# Patient Record
Sex: Male | Born: 1955 | Hispanic: Yes | Marital: Married | Smoking: Former smoker
Health system: Southern US, Community
[De-identification: ages and names within clinical notes are randomized; demographics above are authoritative.]

## PROBLEM LIST (undated history)

## (undated) DIAGNOSIS — E785 Hyperlipidemia, unspecified: Secondary | ICD-10-CM

## (undated) DIAGNOSIS — K219 Gastro-esophageal reflux disease without esophagitis: Secondary | ICD-10-CM

## (undated) HISTORY — PX: LUMBAR LAMINECTOMY: SHX95

## (undated) HISTORY — DX: Gastro-esophageal reflux disease without esophagitis: K21.9

## (undated) HISTORY — DX: Hyperlipidemia, unspecified: E78.5

## (undated) HISTORY — PX: SPINE SURGERY: SHX786

## (undated) HISTORY — PX: OTHER SURGICAL HISTORY: SHX169

---

## 2009-08-10 ENCOUNTER — Ambulatory Visit: Payer: Self-pay | Admitting: Specialist

## 2009-09-19 ENCOUNTER — Ambulatory Visit: Payer: Self-pay | Admitting: Specialist

## 2015-05-07 DIAGNOSIS — M109 Gout, unspecified: Secondary | ICD-10-CM | POA: Insufficient documentation

## 2016-10-20 ENCOUNTER — Ambulatory Visit
Admission: RE | Admit: 2016-10-20 | Discharge: 2016-10-20 | Disposition: A | Discharge status: Home / Self Care | Payer: Commercial Managed Care - PPO | Source: Ambulatory Visit | Attending: Allergy | Admitting: Allergy

## 2016-10-20 ENCOUNTER — Other Ambulatory Visit: Payer: Self-pay | Admitting: Allergy

## 2016-10-20 DIAGNOSIS — J4530 Mild persistent asthma, uncomplicated: Secondary | ICD-10-CM

## 2017-10-07 ENCOUNTER — Other Ambulatory Visit: Payer: Self-pay | Admitting: Physician Assistant

## 2017-10-07 ENCOUNTER — Ambulatory Visit
Admission: RE | Admit: 2017-10-07 | Discharge: 2017-10-07 | Disposition: A | Discharge status: Home / Self Care | Payer: Worker's Compensation | Source: Ambulatory Visit | Attending: Physician Assistant | Admitting: Physician Assistant

## 2017-10-07 DIAGNOSIS — M25521 Pain in right elbow: Secondary | ICD-10-CM | POA: Insufficient documentation

## 2018-12-02 IMAGING — CR DG ELBOW COMPLETE 3+V*R*
5 series · 5 of 5 positions shown · non-contrast
Comparison: None.

CLINICAL DATA: Pain following contusion type injury

EXAM:
RIGHT ELBOW - COMPLETE 3+ VIEW

[elbow ap]
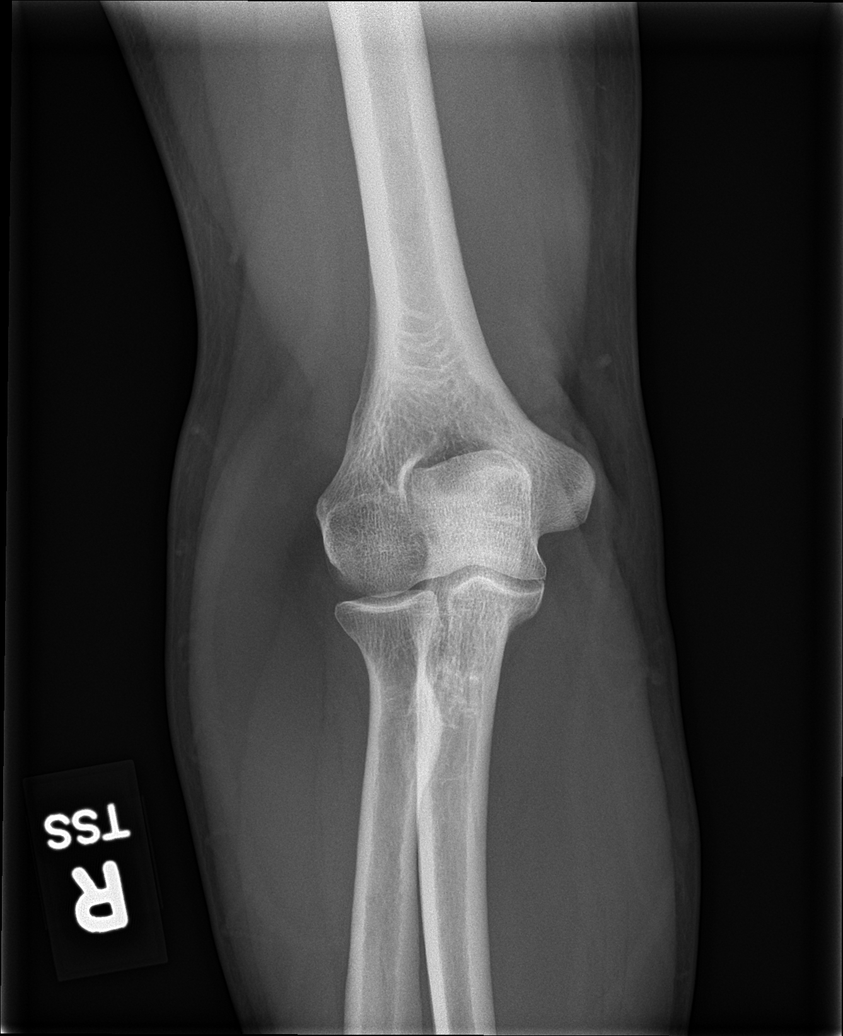

[elbow obl (1 of 2)]
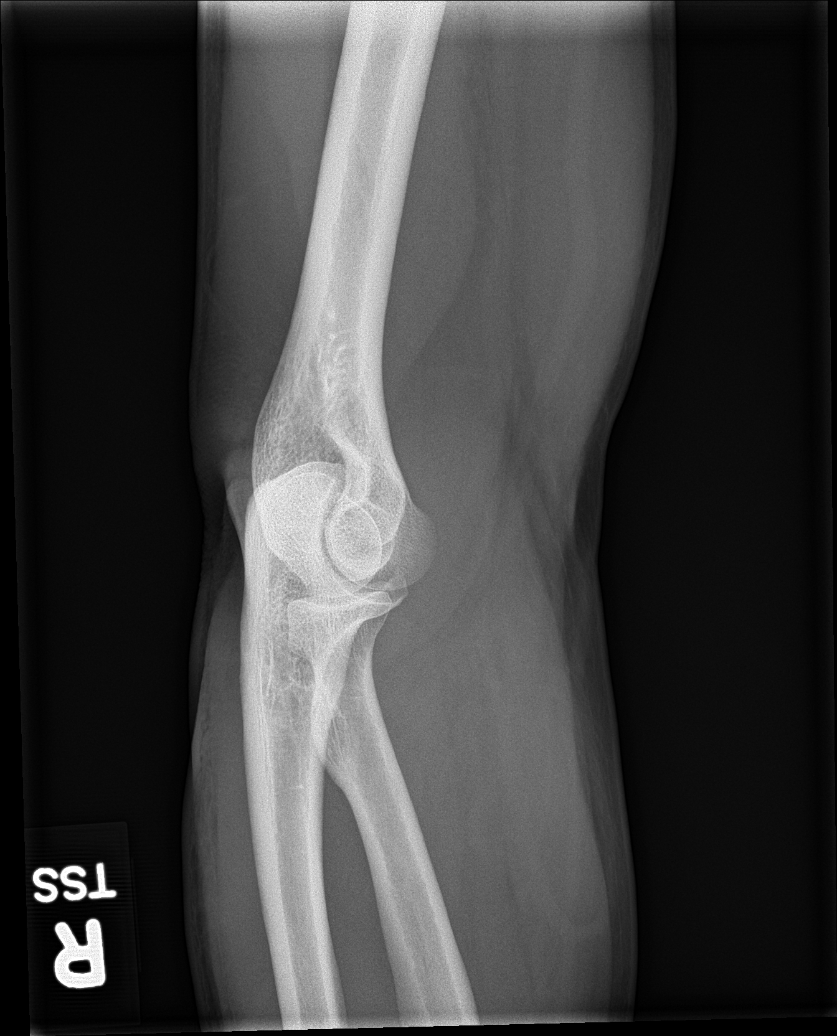

[elbow obl (2 of 2)]
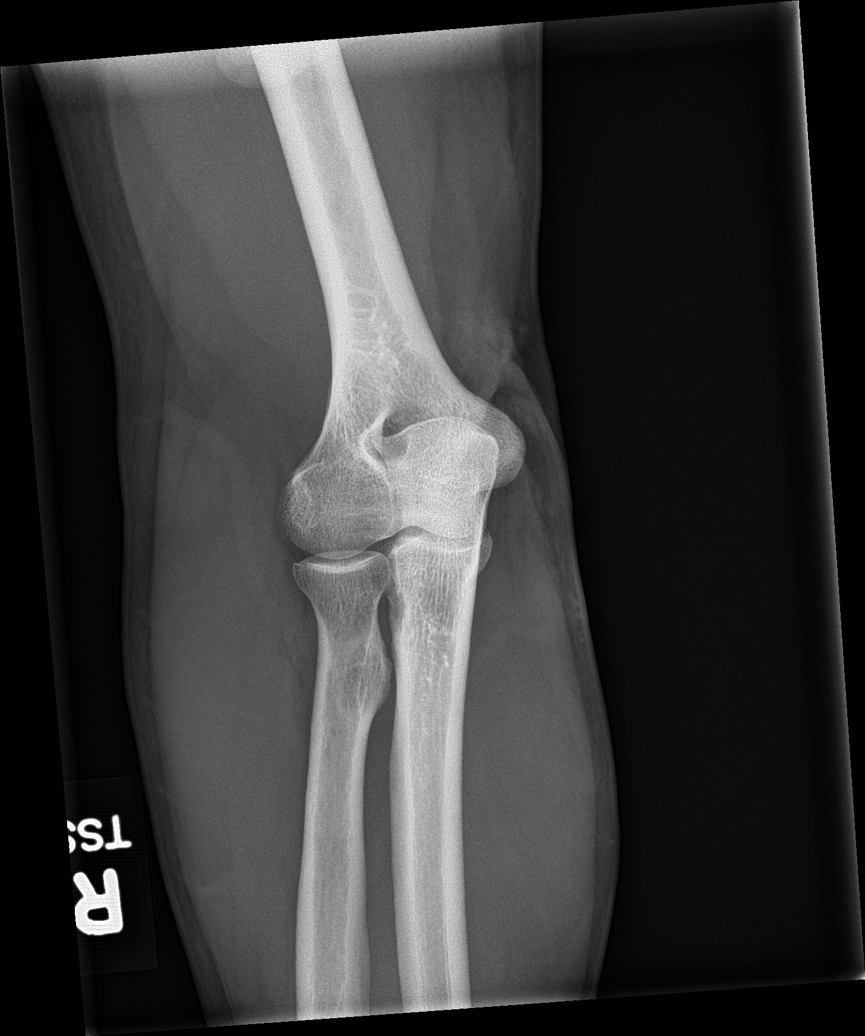

[elbow lat (1 of 2)]
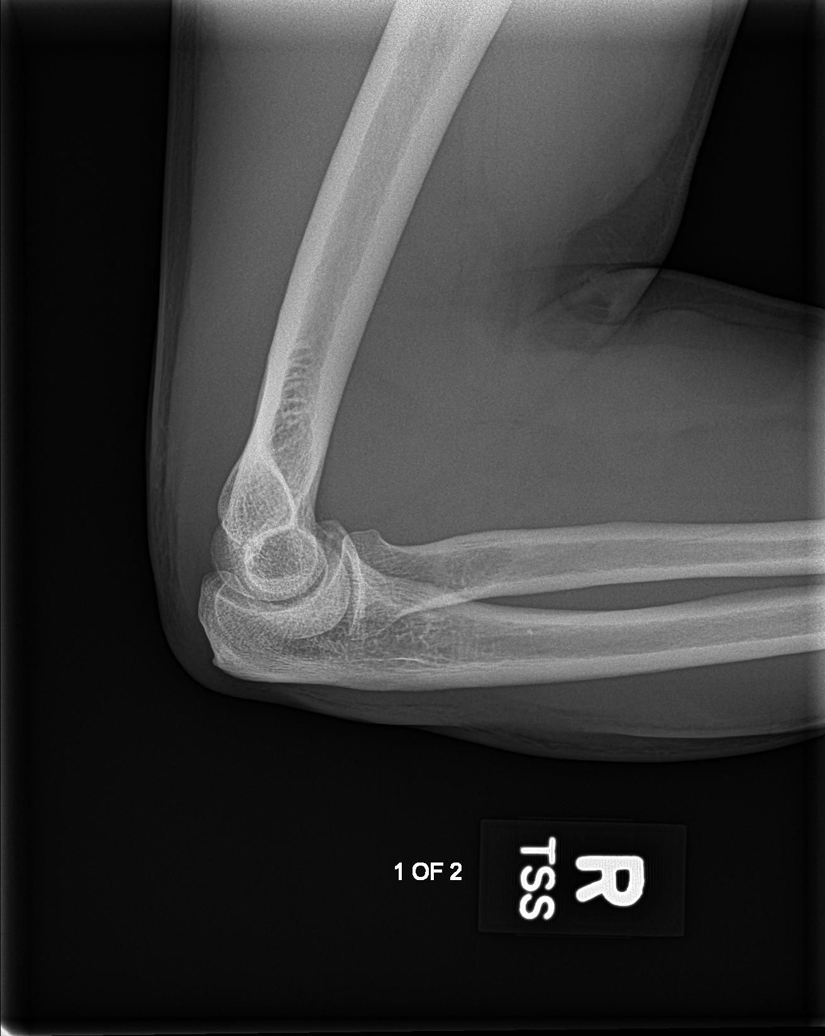

[elbow lat (2 of 2)]
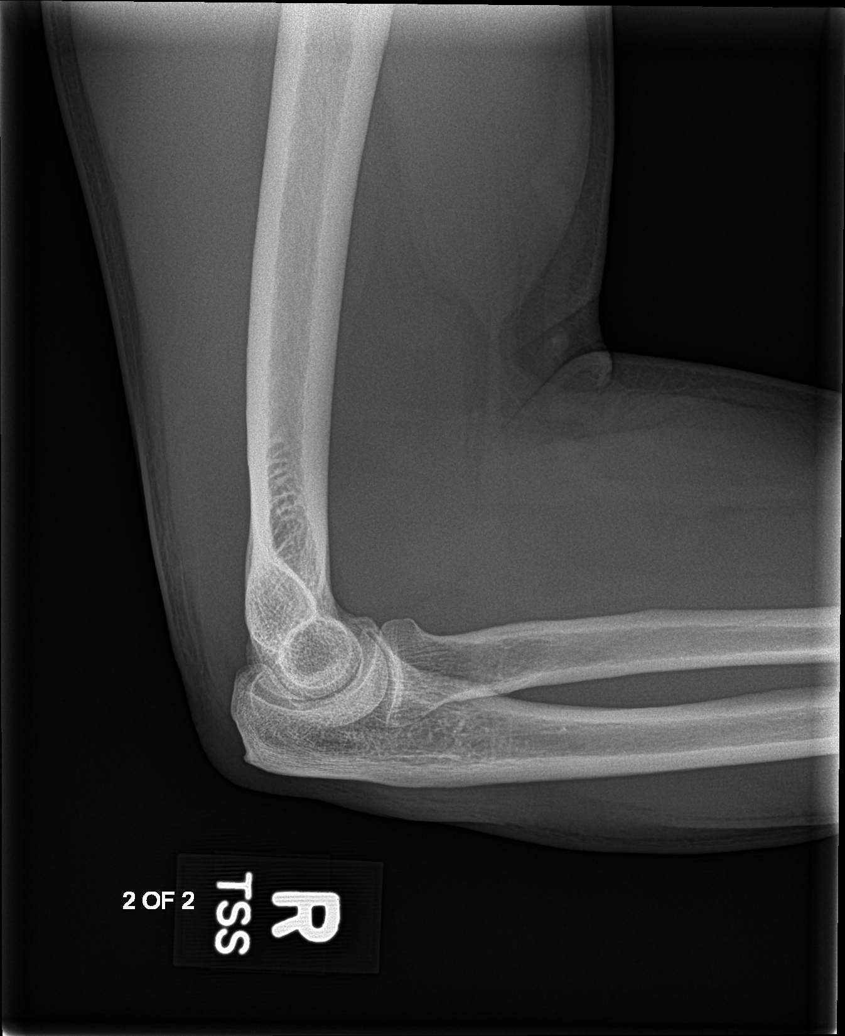

[5 of 5 positions shown; findings below may reference images not displayed]

FINDINGS: Frontal, lateral, and bilateral oblique views were obtained. No
fracture or dislocation. No joint effusion. Joint spaces appear
normal. There is a minimal spur arising from the olecranon process
of the proximal ulna.
IMPRESSION: Minimal spur arising from the olecranon process of the proximal
ulna. No fracture or dislocation. No appreciable joint space
narrowing or erosion.

## 2019-05-15 DIAGNOSIS — I10 Essential (primary) hypertension: Secondary | ICD-10-CM | POA: Insufficient documentation

## 2019-05-24 DIAGNOSIS — E782 Mixed hyperlipidemia: Secondary | ICD-10-CM | POA: Insufficient documentation

## 2019-06-15 DIAGNOSIS — I77810 Thoracic aortic ectasia: Secondary | ICD-10-CM | POA: Insufficient documentation

## 2021-08-14 DIAGNOSIS — E782 Mixed hyperlipidemia: Secondary | ICD-10-CM | POA: Diagnosis not present

## 2021-08-14 DIAGNOSIS — M109 Gout, unspecified: Secondary | ICD-10-CM | POA: Diagnosis not present

## 2021-08-14 DIAGNOSIS — K219 Gastro-esophageal reflux disease without esophagitis: Secondary | ICD-10-CM | POA: Diagnosis not present

## 2021-08-22 DIAGNOSIS — E782 Mixed hyperlipidemia: Secondary | ICD-10-CM | POA: Diagnosis not present

## 2021-08-22 DIAGNOSIS — M109 Gout, unspecified: Secondary | ICD-10-CM | POA: Diagnosis not present

## 2021-08-22 DIAGNOSIS — R0789 Other chest pain: Secondary | ICD-10-CM | POA: Diagnosis not present

## 2021-08-29 DIAGNOSIS — R0789 Other chest pain: Secondary | ICD-10-CM | POA: Diagnosis not present

## 2021-09-05 DIAGNOSIS — E669 Obesity, unspecified: Secondary | ICD-10-CM | POA: Diagnosis not present

## 2021-09-05 DIAGNOSIS — E782 Mixed hyperlipidemia: Secondary | ICD-10-CM | POA: Diagnosis not present

## 2021-09-05 DIAGNOSIS — R079 Chest pain, unspecified: Secondary | ICD-10-CM | POA: Diagnosis not present

## 2021-09-05 DIAGNOSIS — R0602 Shortness of breath: Secondary | ICD-10-CM | POA: Diagnosis not present

## 2021-09-05 DIAGNOSIS — I340 Nonrheumatic mitral (valve) insufficiency: Secondary | ICD-10-CM | POA: Diagnosis not present

## 2021-09-16 DIAGNOSIS — R079 Chest pain, unspecified: Secondary | ICD-10-CM | POA: Diagnosis not present

## 2021-10-05 DIAGNOSIS — R0789 Other chest pain: Secondary | ICD-10-CM | POA: Diagnosis not present

## 2021-10-05 DIAGNOSIS — M109 Gout, unspecified: Secondary | ICD-10-CM | POA: Diagnosis not present

## 2021-10-05 DIAGNOSIS — Z87891 Personal history of nicotine dependence: Secondary | ICD-10-CM | POA: Diagnosis not present

## 2021-10-05 DIAGNOSIS — K219 Gastro-esophageal reflux disease without esophagitis: Secondary | ICD-10-CM | POA: Diagnosis not present

## 2021-10-05 DIAGNOSIS — R079 Chest pain, unspecified: Secondary | ICD-10-CM | POA: Diagnosis not present

## 2021-10-05 DIAGNOSIS — E785 Hyperlipidemia, unspecified: Secondary | ICD-10-CM | POA: Diagnosis not present

## 2021-10-05 DIAGNOSIS — Z79899 Other long term (current) drug therapy: Secondary | ICD-10-CM | POA: Diagnosis not present

## 2021-10-06 DIAGNOSIS — R079 Chest pain, unspecified: Secondary | ICD-10-CM | POA: Diagnosis not present

## 2021-10-08 DIAGNOSIS — M545 Low back pain, unspecified: Secondary | ICD-10-CM | POA: Diagnosis not present

## 2021-10-08 DIAGNOSIS — M961 Postlaminectomy syndrome, not elsewhere classified: Secondary | ICD-10-CM | POA: Diagnosis not present

## 2021-10-13 DIAGNOSIS — R079 Chest pain, unspecified: Secondary | ICD-10-CM | POA: Diagnosis not present

## 2021-10-16 DIAGNOSIS — E669 Obesity, unspecified: Secondary | ICD-10-CM | POA: Diagnosis not present

## 2021-10-16 DIAGNOSIS — I340 Nonrheumatic mitral (valve) insufficiency: Secondary | ICD-10-CM | POA: Diagnosis not present

## 2021-10-16 DIAGNOSIS — E782 Mixed hyperlipidemia: Secondary | ICD-10-CM | POA: Diagnosis not present

## 2021-10-16 DIAGNOSIS — K219 Gastro-esophageal reflux disease without esophagitis: Secondary | ICD-10-CM | POA: Diagnosis not present

## 2021-10-16 DIAGNOSIS — R079 Chest pain, unspecified: Secondary | ICD-10-CM | POA: Diagnosis not present

## 2021-10-30 DIAGNOSIS — H25813 Combined forms of age-related cataract, bilateral: Secondary | ICD-10-CM | POA: Diagnosis not present

## 2022-01-02 DIAGNOSIS — E782 Mixed hyperlipidemia: Secondary | ICD-10-CM | POA: Diagnosis not present

## 2022-01-02 DIAGNOSIS — K219 Gastro-esophageal reflux disease without esophagitis: Secondary | ICD-10-CM | POA: Diagnosis not present

## 2022-01-02 DIAGNOSIS — E668 Other obesity: Secondary | ICD-10-CM | POA: Diagnosis not present

## 2022-01-02 DIAGNOSIS — I10 Essential (primary) hypertension: Secondary | ICD-10-CM | POA: Diagnosis not present

## 2022-01-06 DIAGNOSIS — M961 Postlaminectomy syndrome, not elsewhere classified: Secondary | ICD-10-CM | POA: Diagnosis not present

## 2022-01-06 DIAGNOSIS — M545 Low back pain, unspecified: Secondary | ICD-10-CM | POA: Diagnosis not present

## 2022-01-21 DIAGNOSIS — H2511 Age-related nuclear cataract, right eye: Secondary | ICD-10-CM | POA: Diagnosis not present

## 2022-05-01 DIAGNOSIS — Z0000 Encounter for general adult medical examination without abnormal findings: Secondary | ICD-10-CM | POA: Diagnosis not present

## 2022-05-01 DIAGNOSIS — Z23 Encounter for immunization: Secondary | ICD-10-CM | POA: Diagnosis not present

## 2022-05-01 DIAGNOSIS — Z125 Encounter for screening for malignant neoplasm of prostate: Secondary | ICD-10-CM | POA: Diagnosis not present

## 2022-05-01 DIAGNOSIS — M5136 Other intervertebral disc degeneration, lumbar region: Secondary | ICD-10-CM | POA: Diagnosis not present

## 2022-05-01 DIAGNOSIS — E782 Mixed hyperlipidemia: Secondary | ICD-10-CM | POA: Diagnosis not present

## 2022-05-01 DIAGNOSIS — E668 Other obesity: Secondary | ICD-10-CM | POA: Diagnosis not present

## 2022-05-01 DIAGNOSIS — M109 Gout, unspecified: Secondary | ICD-10-CM | POA: Diagnosis not present

## 2022-05-04 DIAGNOSIS — H018 Other specified inflammations of eyelid: Secondary | ICD-10-CM | POA: Diagnosis not present

## 2022-05-04 DIAGNOSIS — H04213 Epiphora due to excess lacrimation, bilateral lacrimal glands: Secondary | ICD-10-CM | POA: Diagnosis not present

## 2022-06-22 DIAGNOSIS — H25812 Combined forms of age-related cataract, left eye: Secondary | ICD-10-CM | POA: Diagnosis not present

## 2022-06-22 DIAGNOSIS — H04213 Epiphora due to excess lacrimation, bilateral lacrimal glands: Secondary | ICD-10-CM | POA: Diagnosis not present

## 2022-06-22 DIAGNOSIS — H018 Other specified inflammations of eyelid: Secondary | ICD-10-CM | POA: Diagnosis not present

## 2022-07-28 DIAGNOSIS — H04213 Epiphora due to excess lacrimation, bilateral lacrimal glands: Secondary | ICD-10-CM | POA: Diagnosis not present

## 2022-07-28 DIAGNOSIS — H018 Other specified inflammations of eyelid: Secondary | ICD-10-CM | POA: Diagnosis not present

## 2022-07-28 DIAGNOSIS — H25812 Combined forms of age-related cataract, left eye: Secondary | ICD-10-CM | POA: Diagnosis not present

## 2022-08-19 DIAGNOSIS — G629 Polyneuropathy, unspecified: Secondary | ICD-10-CM | POA: Diagnosis not present

## 2022-08-19 DIAGNOSIS — R0683 Snoring: Secondary | ICD-10-CM | POA: Diagnosis not present

## 2022-08-19 DIAGNOSIS — R4189 Other symptoms and signs involving cognitive functions and awareness: Secondary | ICD-10-CM | POA: Diagnosis not present

## 2022-08-19 DIAGNOSIS — M545 Low back pain, unspecified: Secondary | ICD-10-CM | POA: Diagnosis not present

## 2022-08-19 DIAGNOSIS — G8929 Other chronic pain: Secondary | ICD-10-CM | POA: Diagnosis not present

## 2022-08-24 ENCOUNTER — Other Ambulatory Visit (HOSPITAL_COMMUNITY): Payer: Self-pay | Admitting: Neurology

## 2022-08-24 DIAGNOSIS — R4189 Other symptoms and signs involving cognitive functions and awareness: Secondary | ICD-10-CM

## 2022-08-30 ENCOUNTER — Encounter (HOSPITAL_COMMUNITY): Payer: Self-pay

## 2022-08-30 ENCOUNTER — Ambulatory Visit (HOSPITAL_COMMUNITY): Admission: RE | Admit: 2022-08-30 | Payer: Medicare (Managed Care) | Source: Ambulatory Visit

## 2022-09-08 DIAGNOSIS — R200 Anesthesia of skin: Secondary | ICD-10-CM | POA: Diagnosis not present

## 2022-09-08 DIAGNOSIS — R202 Paresthesia of skin: Secondary | ICD-10-CM | POA: Diagnosis not present

## 2022-09-08 DIAGNOSIS — G8929 Other chronic pain: Secondary | ICD-10-CM | POA: Diagnosis not present

## 2022-09-08 DIAGNOSIS — M545 Low back pain, unspecified: Secondary | ICD-10-CM | POA: Diagnosis not present

## 2022-09-14 ENCOUNTER — Other Ambulatory Visit: Payer: Self-pay | Admitting: Neurology

## 2022-09-14 DIAGNOSIS — E782 Mixed hyperlipidemia: Secondary | ICD-10-CM | POA: Diagnosis not present

## 2022-09-14 DIAGNOSIS — R0789 Other chest pain: Secondary | ICD-10-CM | POA: Diagnosis not present

## 2022-09-14 DIAGNOSIS — I77810 Thoracic aortic ectasia: Secondary | ICD-10-CM | POA: Diagnosis not present

## 2022-09-14 DIAGNOSIS — I10 Essential (primary) hypertension: Secondary | ICD-10-CM | POA: Diagnosis not present

## 2022-09-14 DIAGNOSIS — M545 Low back pain, unspecified: Secondary | ICD-10-CM

## 2022-09-19 ENCOUNTER — Ambulatory Visit: Payer: Medicare (Managed Care)

## 2022-09-24 ENCOUNTER — Encounter: Payer: Self-pay | Admitting: Internal Medicine

## 2022-09-24 ENCOUNTER — Ambulatory Visit (INDEPENDENT_AMBULATORY_CARE_PROVIDER_SITE_OTHER): Payer: Medicare (Managed Care) | Admitting: Internal Medicine

## 2022-09-24 VITALS — BP 134/78 | HR 87 | Ht 66.0 in | Wt 198.0 lb

## 2022-09-24 DIAGNOSIS — E782 Mixed hyperlipidemia: Secondary | ICD-10-CM

## 2022-09-24 DIAGNOSIS — K219 Gastro-esophageal reflux disease without esophagitis: Secondary | ICD-10-CM | POA: Diagnosis not present

## 2022-09-24 DIAGNOSIS — M5432 Sciatica, left side: Secondary | ICD-10-CM

## 2022-09-24 DIAGNOSIS — M5416 Radiculopathy, lumbar region: Secondary | ICD-10-CM

## 2022-09-24 DIAGNOSIS — M1A09X0 Idiopathic chronic gout, multiple sites, without tophus (tophi): Secondary | ICD-10-CM

## 2022-09-24 DIAGNOSIS — M48061 Spinal stenosis, lumbar region without neurogenic claudication: Secondary | ICD-10-CM | POA: Insufficient documentation

## 2022-09-24 HISTORY — DX: Radiculopathy, lumbar region: M48.061

## 2022-09-24 HISTORY — DX: Sciatica, left side: M54.32

## 2022-09-24 NOTE — Progress Notes (Deleted)
   Established Patient Office Visit  Subjective:  Patient ID: Mike Barnes, male    DOB: 12-05-1955  Age: 67 y.o. MRN: [id number]  Chief Complaint  Patient presents with   Follow-up    paperwork       No past medical history on file.  Social History   Socioeconomic History   Marital status: Married    Spouse name: Not on file   Number of children: Not on file   Years of education: Not on file   Highest education level: Not on file  Occupational History   Not on file  Tobacco Use   Smoking status: Not on file   Smokeless tobacco: Not on file  Substance and Sexual Activity   Alcohol use: Not on file   Drug use: Not on file   Sexual activity: Not on file  Other Topics Concern   Not on file  Social History Narrative   Not on file   Social Determinants of Health   Financial Resource Strain: Not on file  Food Insecurity: Not on file  Transportation Needs: Not on file  Physical Activity: Not on file  Stress: Not on file  Social Connections: Not on file  Intimate Partner Violence: Not on file    No family history on file.  No Known Allergies  ROS     Objective:   BP 134/78   Pulse 87   Ht 5' 6"$  (1.676 m)   Wt 198 lb (89.8 kg)   SpO2 94%   BMI 31.96 kg/m   Vitals:   09/24/22 0947  BP: 134/78  Pulse: 87  Height: 5' 6"$  (1.676 m)  Weight: 198 lb (89.8 kg)  SpO2: 94%  BMI (Calculated): 31.97    Physical Exam   No results found for any visits on 09/24/22.  No results found for this or any previous visit (from the past 2160 hour(s)).    Assessment & Plan:   Problem List Items Addressed This Visit   None Visit Diagnoses     Idiopathic chronic gout of multiple sites without tophus    -  Primary   Relevant Orders   CMP14+EGFR   Uric acid   Mixed hyperlipidemia       Relevant Medications   rosuvastatin (CRESTOR) 10 MG tablet   Other Relevant Orders   CMP14+EGFR   Lipid Panel w/o Chol/HDL Ratio       Return in about 3 months  (around 12/23/2022).   Total time spent: {AMA time spent:29001} minutes  Perrin Maltese, MD  09/24/2022

## 2022-09-24 NOTE — Progress Notes (Signed)
Established Patient Office Visit  Subjective:  Patient ID: Mike Barnes, male    DOB: April 09, 1956  Age: 67 y.o. MRN: [id number]  Chief Complaint  Patient presents with   Follow-up    paperwork   Hyperlipidemia    Patient comes in for his follow-up today.  He is fasting for blood work.  He continues to have low back pain despite his previous surgeries on the lumbar spine.  He is unable to able to stand for a long period, so I am able to maintain gainful employment.   Past Medical History:  Diagnosis Date   GERD (gastroesophageal reflux disease)    Hyperlipidemia    Left sided sciatica 09/24/2022   Spinal stenosis of lumbar region with radiculopathy 09/24/2022    Social History   Socioeconomic History   Marital status: Married    Spouse name: Not on file   Number of children: Not on file   Years of education: Not on file   Highest education level: Not on file  Occupational History   Not on file  Tobacco Use   Smoking status: Former    Types: Cigarettes   Smokeless tobacco: Never  Substance and Sexual Activity   Alcohol use: Not Currently   Drug use: Never   Sexual activity: Not on file  Other Topics Concern   Not on file  Social History Narrative   Not on file   Social Determinants of Health   Financial Resource Strain: Not on file  Food Insecurity: Not on file  Transportation Needs: Not on file  Physical Activity: Not on file  Stress: Not on file  Social Connections: Not on file  Intimate Partner Violence: Not on file    Family History  Problem Relation Age of Onset   Hypertension Mother    Gout Father     No Known Allergies  Review of Systems  Constitutional:  Positive for malaise/fatigue. Negative for chills and fever.  HENT: Negative.    Eyes: Negative.   Respiratory: Negative.    Cardiovascular: Negative.   Gastrointestinal:  Positive for heartburn. Negative for blood in stool, constipation, melena, nausea and vomiting.  Genitourinary:  Negative.   Musculoskeletal:  Positive for back pain, falls and myalgias.  Skin: Negative.   Neurological:  Negative for tremors, focal weakness and weakness.  Endo/Heme/Allergies: Negative.   Psychiatric/Behavioral: Negative.         Objective:   BP 134/78   Pulse 87   Ht '5\' 6"'$  (1.676 m)   Wt 198 lb (89.8 kg)   SpO2 94%   BMI 31.96 kg/m   Vitals:   09/24/22 0947  BP: 134/78  Pulse: 87  Height: '5\' 6"'$  (1.676 m)  Weight: 198 lb (89.8 kg)  SpO2: 94%  BMI (Calculated): 31.97    Physical Exam Vitals and nursing note reviewed.  Constitutional:      Appearance: Normal appearance. He is obese.  HENT:     Head: Normocephalic.  Cardiovascular:     Rate and Rhythm: Normal rate and regular rhythm.  Pulmonary:     Effort: Pulmonary effort is normal.     Breath sounds: Normal breath sounds.  Abdominal:     General: Abdomen is flat. Bowel sounds are normal.     Palpations: Abdomen is soft.  Musculoskeletal:        General: Normal range of motion.     Cervical back: Normal range of motion.  Skin:    General: Skin is warm and dry.  Neurological:     General: No focal deficit present.     Mental Status: He is alert and oriented to person, place, and time.  Psychiatric:        Mood and Affect: Mood normal.        Behavior: Behavior normal.      No results found for any visits on 09/24/22.  No results found for this or any previous visit (from the past 2160 hour(s)).    Assessment & Plan:  Patient advised to continue all his medications.  He will get fasting blood work today.  Forms will be filled out for his disability due to back pain. Problem List Items Addressed This Visit     Idiopathic chronic gout of multiple sites without tophus - Primary   Relevant Orders   CMP14+EGFR   Uric acid   Mixed hyperlipidemia   Relevant Medications   rosuvastatin (CRESTOR) 10 MG tablet   Other Relevant Orders   CMP14+EGFR   Lipid Panel w/o Chol/HDL Ratio   Spinal stenosis  of lumbar region with radiculopathy   Relevant Medications   gabapentin (NEURONTIN) 300 MG capsule   Left sided sciatica   Relevant Medications   gabapentin (NEURONTIN) 300 MG capsule   Gastroesophageal reflux disease without esophagitis   Relevant Medications   famotidine (PEPCID) 20 MG tablet   pantoprazole (PROTONIX) 40 MG tablet    Return in about 3 months (around 12/23/2022).   Total time spent: 30 minutes  Perrin Maltese, MD  09/24/2022

## 2022-09-25 ENCOUNTER — Other Ambulatory Visit: Payer: Self-pay | Admitting: Internal Medicine

## 2022-09-25 DIAGNOSIS — R7302 Impaired glucose tolerance (oral): Secondary | ICD-10-CM

## 2022-09-25 LAB — CMP14+EGFR
ALT: 32 IU/L (ref 0–44)
AST: 35 IU/L (ref 0–40)
Albumin/Globulin Ratio: 1.9 (ref 1.2–2.2)
Albumin: 4.6 g/dL (ref 3.9–4.9)
Alkaline Phosphatase: 70 IU/L (ref 44–121)
BUN/Creatinine Ratio: 15 (ref 10–24)
BUN: 15 mg/dL (ref 8–27)
Bilirubin Total: 0.5 mg/dL (ref 0.0–1.2)
CO2: 25 mmol/L (ref 20–29)
Calcium: 9.6 mg/dL (ref 8.6–10.2)
Chloride: 99 mmol/L (ref 96–106)
Creatinine, Ser: 1.01 mg/dL (ref 0.76–1.27)
Globulin, Total: 2.4 g/dL (ref 1.5–4.5)
Glucose: 169 mg/dL — ABNORMAL HIGH (ref 70–99)
Potassium: 4.2 mmol/L (ref 3.5–5.2)
Sodium: 139 mmol/L (ref 134–144)
Total Protein: 7 g/dL (ref 6.0–8.5)
eGFR: 82 mL/min/{1.73_m2} (ref >59)

## 2022-09-25 LAB — LIPID PANEL W/O CHOL/HDL RATIO
Cholesterol, Total: 133 mg/dL (ref 100–199)
HDL: 50 mg/dL (ref >39)
LDL Chol Calc (NIH): 54 mg/dL (ref 0–99)
Triglycerides: 176 mg/dL — ABNORMAL HIGH (ref 0–149)
VLDL Cholesterol Cal: 29 mg/dL (ref 5–40)

## 2022-09-25 LAB — URIC ACID: Uric Acid: 6.6 mg/dL (ref 3.8–8.4)

## 2022-10-26 ENCOUNTER — Other Ambulatory Visit: Payer: Self-pay | Admitting: Cardiovascular Disease

## 2022-10-26 DIAGNOSIS — R0789 Other chest pain: Secondary | ICD-10-CM

## 2022-11-08 ENCOUNTER — Other Ambulatory Visit: Payer: Self-pay | Admitting: Internal Medicine

## 2022-11-10 ENCOUNTER — Other Ambulatory Visit: Payer: Self-pay

## 2022-11-13 ENCOUNTER — Ambulatory Visit (HOSPITAL_COMMUNITY)
Admission: RE | Admit: 2022-11-13 | Discharge: 2022-11-13 | Disposition: A | Discharge status: Home / Self Care | Payer: Medicare (Managed Care) | Source: Ambulatory Visit | Attending: Neurology | Admitting: Neurology

## 2022-11-13 DIAGNOSIS — R4189 Other symptoms and signs involving cognitive functions and awareness: Secondary | ICD-10-CM | POA: Diagnosis not present

## 2022-11-13 DIAGNOSIS — M545 Low back pain, unspecified: Secondary | ICD-10-CM | POA: Insufficient documentation

## 2022-11-13 DIAGNOSIS — G9389 Other specified disorders of brain: Secondary | ICD-10-CM | POA: Diagnosis not present

## 2022-11-13 DIAGNOSIS — M5126 Other intervertebral disc displacement, lumbar region: Secondary | ICD-10-CM | POA: Diagnosis not present

## 2022-11-27 DIAGNOSIS — M545 Low back pain, unspecified: Secondary | ICD-10-CM | POA: Diagnosis not present

## 2022-11-27 DIAGNOSIS — M48061 Spinal stenosis, lumbar region without neurogenic claudication: Secondary | ICD-10-CM | POA: Diagnosis not present

## 2022-11-27 DIAGNOSIS — M5416 Radiculopathy, lumbar region: Secondary | ICD-10-CM | POA: Diagnosis not present

## 2022-11-27 DIAGNOSIS — M542 Cervicalgia: Secondary | ICD-10-CM | POA: Diagnosis not present

## 2022-11-27 DIAGNOSIS — G939 Disorder of brain, unspecified: Secondary | ICD-10-CM | POA: Diagnosis not present

## 2022-11-27 DIAGNOSIS — R4189 Other symptoms and signs involving cognitive functions and awareness: Secondary | ICD-10-CM | POA: Diagnosis not present

## 2022-11-27 DIAGNOSIS — R202 Paresthesia of skin: Secondary | ICD-10-CM | POA: Diagnosis not present

## 2022-11-27 DIAGNOSIS — N400 Enlarged prostate without lower urinary tract symptoms: Secondary | ICD-10-CM | POA: Insufficient documentation

## 2022-11-27 DIAGNOSIS — G8929 Other chronic pain: Secondary | ICD-10-CM | POA: Diagnosis not present

## 2022-11-30 DIAGNOSIS — H25813 Combined forms of age-related cataract, bilateral: Secondary | ICD-10-CM | POA: Diagnosis not present

## 2022-11-30 DIAGNOSIS — H25812 Combined forms of age-related cataract, left eye: Secondary | ICD-10-CM | POA: Diagnosis not present

## 2022-12-02 ENCOUNTER — Other Ambulatory Visit: Payer: Self-pay | Admitting: Student

## 2022-12-02 DIAGNOSIS — R202 Paresthesia of skin: Secondary | ICD-10-CM

## 2022-12-02 DIAGNOSIS — M542 Cervicalgia: Secondary | ICD-10-CM

## 2022-12-17 ENCOUNTER — Encounter: Payer: Self-pay | Admitting: Student

## 2022-12-23 ENCOUNTER — Ambulatory Visit
Admission: RE | Admit: 2022-12-23 | Discharge: 2022-12-23 | Disposition: A | Discharge status: Home / Self Care | Payer: Medicare (Managed Care) | Source: Ambulatory Visit | Attending: Student | Admitting: Student

## 2022-12-23 DIAGNOSIS — R202 Paresthesia of skin: Secondary | ICD-10-CM | POA: Diagnosis not present

## 2022-12-23 DIAGNOSIS — M542 Cervicalgia: Secondary | ICD-10-CM

## 2022-12-24 ENCOUNTER — Encounter: Payer: Self-pay | Admitting: Internal Medicine

## 2022-12-24 ENCOUNTER — Ambulatory Visit (INDEPENDENT_AMBULATORY_CARE_PROVIDER_SITE_OTHER): Payer: Medicare (Managed Care) | Admitting: Internal Medicine

## 2022-12-24 VITALS — BP 140/82 | HR 78 | Ht 66.0 in | Wt 201.0 lb

## 2022-12-24 DIAGNOSIS — M1A09X0 Idiopathic chronic gout, multiple sites, without tophus (tophi): Secondary | ICD-10-CM

## 2022-12-24 DIAGNOSIS — R7303 Prediabetes: Secondary | ICD-10-CM | POA: Diagnosis not present

## 2022-12-24 DIAGNOSIS — I10 Essential (primary) hypertension: Secondary | ICD-10-CM | POA: Diagnosis not present

## 2022-12-24 DIAGNOSIS — E782 Mixed hyperlipidemia: Secondary | ICD-10-CM | POA: Diagnosis not present

## 2022-12-24 MED ORDER — LISINOPRIL 5 MG PO TABS
5.0000 mg | ORAL_TABLET | Freq: Every day | ORAL | 6 refills | Status: DC
Start: 2022-12-24 — End: 2023-04-16

## 2022-12-24 NOTE — Progress Notes (Signed)
Established Patient Office Visit  Subjective:  Patient ID: Mike Barnes, male    DOB: [birth year]  Age: 67 y.o. MRN: [id number]  Chief Complaint  Patient presents with   Follow-up    3 month follow up    Patient comes in for his 60-month follow-up today.  He states he is not feeling well.  He has several blood pressure readings from home which are high.  Patient's daughter got on the phone to discuss his blood pressure situation.  At his last labs his blood glucose was also found to be high.  We were trying to contact the patient to come in for hemoglobin A1c but he never responded.  Will get fasting labs today.  And start him on Lisinopril 5 mg once a day. Patient's daughter was informed of this who then translated to her father.  She was advised to come with him for his next appointment. In general he feels tired and  also has chronic low back pain.  He recently had his right eye cataract surgery done. He does not have any chest pain or shortness of breath.  Does not report of any dizziness.    No other concerns at this time.   Past Medical History:  Diagnosis Date   GERD (gastroesophageal reflux disease)    Hyperlipidemia    Left sided sciatica 09/24/2022   Spinal stenosis of lumbar region with radiculopathy 09/24/2022    Past Surgical History:  Procedure Laterality Date   left shoulder surgery     LUMBAR LAMINECTOMY Left    SPINE SURGERY      Social History   Socioeconomic History   Marital status: Married    Spouse name: Not on file   Number of children: Not on file   Years of education: Not on file   Highest education level: Not on file  Occupational History   Not on file  Tobacco Use   Smoking status: Former    Types: Cigarettes   Smokeless tobacco: Never  Substance and Sexual Activity   Alcohol use: Not Currently   Drug use: Never   Sexual activity: Not on file  Other Topics Concern   Not on file  Social History Narrative   Not on file   Social  Determinants of Health   Financial Resource Strain: Not on file  Food Insecurity: Not on file  Transportation Needs: Not on file  Physical Activity: Not on file  Stress: Not on file  Social Connections: Not on file  Intimate Partner Violence: Not on file    Family History  Problem Relation Age of Onset   Hypertension Mother    Gout Father     No Known Allergies  Review of Systems  Constitutional:  Positive for malaise/fatigue. Negative for chills, diaphoresis, fever and weight loss.  HENT: Negative.    Eyes:  Positive for blurred vision. Negative for double vision, photophobia, pain, discharge and redness.  Respiratory:  Negative for cough, shortness of breath and wheezing.   Cardiovascular:  Negative for chest pain, orthopnea, leg swelling and PND.  Gastrointestinal:  Negative for abdominal pain, blood in stool, constipation, diarrhea, heartburn and nausea.  Genitourinary:  Negative for dysuria and flank pain.  Musculoskeletal:  Positive for back pain and joint pain. Negative for falls and myalgias.  Skin: Negative.   Neurological:  Negative for dizziness, focal weakness and headaches.  Psychiatric/Behavioral:  Negative for depression, hallucinations and suicidal ideas. The patient is not nervous/anxious.  Objective:   BP (!) 140/82   Pulse 78   Ht 5\' 6"  (1.676 m)   Wt 201 lb (91.2 kg)   SpO2 97%   BMI 32.44 kg/m   Vitals:   12/24/22 0951  BP: (!) 140/82  Pulse: 78  Height: 5\' 6"  (1.676 m)  Weight: 201 lb (91.2 kg)  SpO2: 97%  BMI (Calculated): 32.46    Physical Exam Vitals and nursing note reviewed.  Constitutional:      General: He is not in acute distress.    Appearance: Normal appearance.  HENT:     Head: Normocephalic and atraumatic.  Cardiovascular:     Rate and Rhythm: Normal rate and regular rhythm.     Pulses: Normal pulses.     Heart sounds: Normal heart sounds. No murmur heard. Pulmonary:     Effort: Pulmonary effort is normal.      Breath sounds: Normal breath sounds. No wheezing or rales.  Abdominal:     General: Bowel sounds are normal. There is no distension.     Palpations: Abdomen is soft. There is no mass.     Tenderness: There is no right CVA tenderness or left CVA tenderness.  Musculoskeletal:        General: Normal range of motion.     Cervical back: Normal range of motion and neck supple.     Right lower leg: No edema.     Left lower leg: No edema.  Lymphadenopathy:     Cervical: No cervical adenopathy.  Skin:    General: Skin is warm.  Neurological:     General: No focal deficit present.     Mental Status: He is alert.  Psychiatric:        Mood and Affect: Mood normal.        Behavior: Behavior normal.      No results found for any visits on 12/24/22.  No results found for this or any previous visit (from the past 2160 hour(s)).    Assessment & Plan:  Patient will get his blood work done today.  Start Zestril 5 mg p.o. once a day.  Monitor blood pressure at home.  Patient will return in 1 week with his daughter to discuss the lab results. Problem List Items Addressed This Visit     Idiopathic chronic gout of multiple sites without tophus   Relevant Orders   CBC With Differential   Mixed hyperlipidemia   Relevant Medications   lisinopril (ZESTRIL) 5 MG tablet   Other Relevant Orders   Lipid Panel w/o Chol/HDL Ratio   Essential hypertension, benign - Primary   Relevant Medications   lisinopril (ZESTRIL) 5 MG tablet   Other Relevant Orders   CMP14+EGFR   Prediabetes   Relevant Orders   Hemoglobin A1c    Return in about 1 week (around 12/31/2022).   Total time spent: 30 minutes  Margaretann Loveless, MD  12/24/2022   This document may have been prepared by [facility] Voice Recognition software and as such may include unintentional dictation errors.

## 2022-12-25 LAB — LIPID PANEL W/O CHOL/HDL RATIO
Cholesterol, Total: 163 mg/dL (ref 100–199)
HDL: 60 mg/dL (ref >39)
LDL Chol Calc (NIH): 73 mg/dL (ref 0–99)
Triglycerides: 178 mg/dL — ABNORMAL HIGH (ref 0–149)
VLDL Cholesterol Cal: 30 mg/dL (ref 5–40)

## 2022-12-25 LAB — CMP14+EGFR
ALT: 28 IU/L (ref 0–44)
AST: 33 IU/L (ref 0–40)
Albumin/Globulin Ratio: 1.9 (ref 1.2–2.2)
Albumin: 4.8 g/dL (ref 3.9–4.9)
Alkaline Phosphatase: 76 IU/L (ref 44–121)
BUN/Creatinine Ratio: 18 (ref 10–24)
BUN: 18 mg/dL (ref 8–27)
Bilirubin Total: 0.6 mg/dL (ref 0.0–1.2)
CO2: 25 mmol/L (ref 20–29)
Calcium: 10.2 mg/dL (ref 8.6–10.2)
Chloride: 98 mmol/L (ref 96–106)
Creatinine, Ser: 1 mg/dL (ref 0.76–1.27)
Globulin, Total: 2.5 g/dL (ref 1.5–4.5)
Glucose: 110 mg/dL — ABNORMAL HIGH (ref 70–99)
Potassium: 4.6 mmol/L (ref 3.5–5.2)
Sodium: 138 mmol/L (ref 134–144)
Total Protein: 7.3 g/dL (ref 6.0–8.5)
eGFR: 83 mL/min/{1.73_m2} (ref >59)

## 2022-12-25 LAB — CBC WITH DIFFERENTIAL
Basophils Absolute: 0.1 10*3/uL (ref 0.0–0.2)
Basos: 1 %
EOS (ABSOLUTE): 0.4 10*3/uL (ref 0.0–0.4)
Eos: 5 %
Hematocrit: 44.6 % (ref 37.5–51.0)
Hemoglobin: 15.3 g/dL (ref 13.0–17.7)
Immature Grans (Abs): 0 10*3/uL (ref 0.0–0.1)
Immature Granulocytes: 0 %
Lymphocytes Absolute: 2.7 10*3/uL (ref 0.7–3.1)
Lymphs: 42 %
MCH: 32.7 pg (ref 26.6–33.0)
MCHC: 34.3 g/dL (ref 31.5–35.7)
MCV: 95 fL (ref 79–97)
Monocytes Absolute: 0.6 10*3/uL (ref 0.1–0.9)
Monocytes: 9 %
Neutrophils Absolute: 2.8 10*3/uL (ref 1.4–7.0)
Neutrophils: 43 %
RBC: 4.68 x10E6/uL (ref 4.14–5.80)
RDW: 13.2 % (ref 11.6–15.4)
WBC: 6.4 10*3/uL (ref 3.4–10.8)

## 2022-12-25 LAB — HEMOGLOBIN A1C
Est. average glucose Bld gHb Est-mCnc: 131 mg/dL
Hgb A1c MFr Bld: 6.2 % — ABNORMAL HIGH (ref 4.8–5.6)

## 2022-12-29 ENCOUNTER — Ambulatory Visit: Payer: Medicare (Managed Care) | Admitting: Neurosurgery

## 2022-12-31 ENCOUNTER — Ambulatory Visit (INDEPENDENT_AMBULATORY_CARE_PROVIDER_SITE_OTHER): Payer: Medicare (Managed Care) | Admitting: Internal Medicine

## 2022-12-31 ENCOUNTER — Encounter: Payer: Self-pay | Admitting: Internal Medicine

## 2022-12-31 VITALS — BP 116/72 | HR 80 | Ht 63.0 in | Wt 203.4 lb

## 2022-12-31 DIAGNOSIS — I10 Essential (primary) hypertension: Secondary | ICD-10-CM

## 2022-12-31 DIAGNOSIS — R7303 Prediabetes: Secondary | ICD-10-CM

## 2022-12-31 DIAGNOSIS — E782 Mixed hyperlipidemia: Secondary | ICD-10-CM

## 2022-12-31 DIAGNOSIS — M1A09X0 Idiopathic chronic gout, multiple sites, without tophus (tophi): Secondary | ICD-10-CM

## 2022-12-31 NOTE — Progress Notes (Signed)
Established Patient Office Visit  Subjective:  Patient ID: Mike Barnes, male    DOB: 11/26/1955  Age: 66 y.o. MRN: [id number]  Chief Complaint  Patient presents with   Follow-up    1 week follow up    Patient comes in for his follow-up today, accompanied by his wife who helps to translate.  Last week patient had come in with high blood pressure and he was started on Zestril 5 mg once a day.  Today his blood pressure is much better, 116/72.  He mentions however it drops to even 90 systolic so he has started cutting his 5 mg of Zestril into half and takes 2.5 a day. Labs discussed his hemoglobin A1c now is 6.2.  His triglycerides are still above normal. Patient advised to be very strict about his diet control and avoid concentrated sweets and carbohydrates.  He will continue his fenofibrate regularly. No other complaints.    No other concerns at this time.   Past Medical History:  Diagnosis Date   GERD (gastroesophageal reflux disease)    Hyperlipidemia    Left sided sciatica 09/24/2022   Spinal stenosis of lumbar region with radiculopathy 09/24/2022    Past Surgical History:  Procedure Laterality Date   left shoulder surgery     LUMBAR LAMINECTOMY Left    SPINE SURGERY      Social History   Socioeconomic History   Marital status: Married    Spouse name: Not on file   Number of children: Not on file   Years of education: Not on file   Highest education level: Not on file  Occupational History   Not on file  Tobacco Use   Smoking status: Former    Types: Cigarettes   Smokeless tobacco: Never  Substance and Sexual Activity   Alcohol use: Not Currently   Drug use: Never   Sexual activity: Not on file  Other Topics Concern   Not on file  Social History Narrative   Not on file   Social Determinants of Health   Financial Resource Strain: Not on file  Food Insecurity: Not on file  Transportation Needs: Not on file  Physical Activity: Not on file   Stress: Not on file  Social Connections: Not on file  Intimate Partner Violence: Not on file    Family History  Problem Relation Age of Onset   Hypertension Mother    Gout Father     No Known Allergies  Review of Systems  Constitutional:  Negative for chills, diaphoresis, fever, malaise/fatigue and weight loss.  HENT: Negative.    Eyes: Negative.   Respiratory:  Negative for cough, shortness of breath and wheezing.   Cardiovascular:  Negative for chest pain, palpitations, leg swelling and PND.  Gastrointestinal:  Negative for abdominal pain, blood in stool, diarrhea, heartburn, melena, nausea and vomiting.  Genitourinary: Negative.   Musculoskeletal:  Negative for back pain, joint pain and myalgias.  Skin: Negative.   Neurological: Negative.   Psychiatric/Behavioral: Negative.         Objective:   BP 116/72   Pulse 80   Ht 5\' 3"  (1.6 m)   Wt 203 lb 6.4 oz (92.3 kg)   SpO2 95%   BMI 36.03 kg/m   Vitals:   12/31/22 1406  BP: 116/72  Pulse: 80  Height: 5\' 3"  (1.6 m)  Weight: 203 lb 6.4 oz (92.3 kg)  SpO2: 95%  BMI (Calculated): 36.04    Physical Exam Vitals and nursing note reviewed.  Constitutional:      Appearance: Normal appearance.  HENT:     Head: Normocephalic and atraumatic.  Cardiovascular:     Rate and Rhythm: Normal rate.  Pulmonary:     Effort: Pulmonary effort is normal.     Breath sounds: Normal breath sounds. No wheezing, rhonchi or rales.  Abdominal:     General: Bowel sounds are normal.     Palpations: Abdomen is soft.  Musculoskeletal:        General: Normal range of motion.     Cervical back: Normal range of motion. No rigidity.     Right lower leg: No edema.     Left lower leg: No edema.  Lymphadenopathy:     Cervical: No cervical adenopathy.  Neurological:     General: No focal deficit present.     Mental Status: He is alert and oriented to person, place, and time.  Psychiatric:        Mood and Affect: Mood normal.         Behavior: Behavior normal.      No results found for any visits on 12/31/22.  Recent Results (from the past 2160 hour(s))  CMP14+EGFR     Status: Abnormal   Collection Time: 12/24/22 10:21 AM  Result Value Ref Range   Glucose 110 (H) 70 - 99 mg/dL   BUN 18 8 - 27 mg/dL   Creatinine, Ser 1.61 0.76 - 1.27 mg/dL   eGFR 83 >09 UE/AVW/0.98   BUN/Creatinine Ratio 18 10 - 24   Sodium 138 134 - 144 mmol/L   Potassium 4.6 3.5 - 5.2 mmol/L   Chloride 98 96 - 106 mmol/L   CO2 25 20 - 29 mmol/L   Calcium 10.2 8.6 - 10.2 mg/dL   Total Protein 7.3 6.0 - 8.5 g/dL   Albumin 4.8 3.9 - 4.9 g/dL   Globulin, Total 2.5 1.5 - 4.5 g/dL   Albumin/Globulin Ratio 1.9 1.2 - 2.2   Bilirubin Total 0.6 0.0 - 1.2 mg/dL   Alkaline Phosphatase 76 44 - 121 IU/L   AST 33 0 - 40 IU/L   ALT 28 0 - 44 IU/L  CBC With Differential     Status: None   Collection Time: 12/24/22 10:21 AM  Result Value Ref Range   WBC 6.4 3.4 - 10.8 x10E3/uL   RBC 4.68 4.14 - 5.80 x10E6/uL   Hemoglobin 15.3 13.0 - 17.7 g/dL   Hematocrit 11.9 14.7 - 51.0 %   MCV 95 79 - 97 fL   MCH 32.7 26.6 - 33.0 pg   MCHC 34.3 31.5 - 35.7 g/dL   RDW 82.9 56.2 - 13.0 %   Neutrophils 43 Not Estab. %   Lymphs 42 Not Estab. %   Monocytes 9 Not Estab. %   Eos 5 Not Estab. %   Basos 1 Not Estab. %   Neutrophils Absolute 2.8 1.4 - 7.0 x10E3/uL   Lymphocytes Absolute 2.7 0.7 - 3.1 x10E3/uL   Monocytes Absolute 0.6 0.1 - 0.9 x10E3/uL   EOS (ABSOLUTE) 0.4 0.0 - 0.4 x10E3/uL   Basophils Absolute 0.1 0.0 - 0.2 x10E3/uL   Immature Granulocytes 0 Not Estab. %   Immature Grans (Abs) 0.0 0.0 - 0.1 x10E3/uL  Lipid Panel w/o Chol/HDL Ratio     Status: Abnormal   Collection Time: 12/24/22 10:21 AM  Result Value Ref Range   Cholesterol, Total 163 100 - 199 mg/dL   Triglycerides 865 (H) 0 - 149 mg/dL   HDL 60 >78  mg/dL   VLDL Cholesterol Cal 30 5 - 40 mg/dL   LDL Chol Calc (NIH) 73 0 - 99 mg/dL  Hemoglobin W0J     Status: Abnormal   Collection Time:  12/24/22 10:21 AM  Result Value Ref Range   Hgb A1c MFr Bld 6.2 (H) 4.8 - 5.6 %    Comment:          Prediabetes: 5.7 - 6.4          Diabetes: >6.4          Glycemic control for adults with diabetes: <7.0    Est. average glucose Bld gHb Est-mCnc 131 mg/dL      Assessment & Plan:  Patient advised to continue taking all his medications as such.  Start a strict diet control for carbohydrates and fatty foods.  Monitor blood pressure at home. Problem List Items Addressed This Visit     Idiopathic chronic gout of multiple sites without tophus   Mixed hyperlipidemia   Essential hypertension, benign   Prediabetes - Primary    Return in about 1 month (around 01/30/2023).   Total time spent: 25 minutes  Margaretann Loveless, MD  12/31/2022   This document may have been prepared by [facility] Voice Recognition software and as such may include unintentional dictation errors.

## 2023-01-14 DIAGNOSIS — R42 Dizziness and giddiness: Secondary | ICD-10-CM | POA: Diagnosis not present

## 2023-01-14 DIAGNOSIS — R252 Cramp and spasm: Secondary | ICD-10-CM | POA: Diagnosis not present

## 2023-01-14 DIAGNOSIS — I10 Essential (primary) hypertension: Secondary | ICD-10-CM | POA: Diagnosis not present

## 2023-01-14 DIAGNOSIS — I77810 Thoracic aortic ectasia: Secondary | ICD-10-CM | POA: Diagnosis not present

## 2023-01-14 DIAGNOSIS — E569 Vitamin deficiency, unspecified: Secondary | ICD-10-CM | POA: Diagnosis not present

## 2023-01-14 DIAGNOSIS — E782 Mixed hyperlipidemia: Secondary | ICD-10-CM | POA: Diagnosis not present

## 2023-01-18 NOTE — Progress Notes (Deleted)
Referring Physician:  Janice Coffin, PA-C [address] [place],  [place] [address]  Primary Physician:  Margaretann Loveless, MD  History of Present Illness: 01/18/2023 [name] is here today with a chief complaint of ***  Chronic low Back pain with history of L3-L4 L4-L5 fusion and laminectomy (2022)  concerning for peripheral neuropathy with weakness, tingling, and numbness in bilateral legs in patient with history of arthritis and prediabetes Pain management beginning before 2022  Duration: ***2 years + Location: *** Quality: ***Numbness and tingling  Severity: ***  Precipitating: aggravated by ***walking Modifying factors: made better by *** Weakness: none Timing: *** Bowel/Bladder Dysfunction: none  Conservative measures:  Physical therapy: *** ? denies Multimodal medical therapy including regular antiinflammatories: *** Gabapentin, Meloxicam Injections: *** epidural steroid injections  Past Surgery: ***  Mike Barnes has ***no symptoms of cervical myelopathy.  The symptoms are causing a significant impact on the patient's life.   Review of Systems:  A 10 point review of systems is negative, except for the pertinent positives and negatives detailed in the HPI.  Past Medical History: Past Medical History:  Diagnosis Date   GERD (gastroesophageal reflux disease)    Hyperlipidemia    Left sided sciatica 09/24/2022   Spinal stenosis of lumbar region with radiculopathy 09/24/2022    Past Surgical History: Past Surgical History:  Procedure Laterality Date   left shoulder surgery     LUMBAR LAMINECTOMY Left    SPINE SURGERY      Allergies: Allergies as of 01/19/2023   (No Known Allergies)    Medications: Outpatient Encounter Medications as of 01/19/2023  Medication Sig   allopurinol (ZYLOPRIM) 100 MG tablet TAKE 1 TABLET BY MOUTH TWICE DAILY   colchicine 0.6 MG tablet Take 1 tablet by mouth 2 (two) times daily as needed.   famotidine  (PEPCID) 40 MG tablet TAKE 1 TABLET BY MOUTH EVERY DAY   gabapentin (NEURONTIN) 300 MG capsule TAKE 1 CAPSULE BY MOUTH AT BEDTIME FOR 3 DAYS. INCREASE TO 2 TIMES DAILY FOR 3 DAYS, THEN. INCREASE TO 3 TIMES DAILY   lisinopril (ZESTRIL) 5 MG tablet Take 1 tablet (5 mg total) by mouth daily.   meloxicam (MOBIC) 15 MG tablet Take 15 mg by mouth daily.   pantoprazole (PROTONIX) 40 MG tablet Take 1 tablet by mouth daily.   rosuvastatin (CRESTOR) 10 MG tablet Take 1 tablet by mouth daily.   No facility-administered encounter medications on file as of 01/19/2023.    Social History: Social History   Tobacco Use   Smoking status: Former    Types: Cigarettes   Smokeless tobacco: Never  Substance Use Topics   Alcohol use: Not Currently   Drug use: Never    Family Medical History: Family History  Problem Relation Age of Onset   Hypertension Mother    Gout Father     Physical Examination: @VITALWITHPAIN @  General: Patient is well developed, well nourished, calm, collected, and in no apparent distress. Attention to examination is appropriate.  Psychiatric: Patient is non-anxious.  Head:  Pupils equal, round, and reactive to light.  ENT:  Oral mucosa appears well hydrated.  Neck:   Supple.  ***Full range of motion.  Respiratory: Patient is breathing without any difficulty.  Extremities: No edema.  Vascular: Palpable dorsal pedal pulses.  Skin:   On exposed skin, there are no abnormal skin lesions.  NEUROLOGICAL:     Awake, alert, oriented to person, place, and time.  Speech is clear and  fluent. Fund of knowledge is appropriate.   Cranial Nerves: Pupils equal round and reactive to light.  Facial tone is symmetric.  Facial sensation is symmetric.  ROM of spine: ***full.  Palpation of spine: ***non tender.    Strength: Side Biceps Triceps Deltoid Interossei Grip Wrist Ext. Wrist Flex.  R 5 5 5 5 5 5 5   L 5 5 5 5 5 5 5    Side Iliopsoas Quads Hamstring PF DF EHL  R 5 5 5 5 5  5   L 5 5 5 5 5 5    Reflexes are ***2+ and symmetric at the biceps, triceps, brachioradialis, patella and achilles.   Hoffman's is absent.  Clonus is not present.  Toes are down-going.  Bilateral upper and lower extremity sensation is intact to light touch.    Gait is normal.   No difficulty with tandem gait.   No evidence of dysmetria noted.  Medical Decision Making  Imaging: ***  I have personally reviewed the images and agree with the above interpretation.  Assessment and Plan: Mr. [name] is a pleasant 67 y.o. male with ***    Thank you for involving me in the care of this patient.   I spent a total of *** minutes in both face-to-face and non-face-to-face activities for this visit on the date of this encounter.   [facility]

## 2023-01-19 ENCOUNTER — Ambulatory Visit: Payer: Medicare (Managed Care) | Admitting: Neurosurgery

## 2023-01-21 ENCOUNTER — Other Ambulatory Visit: Payer: Self-pay | Admitting: Cardiovascular Disease

## 2023-01-21 ENCOUNTER — Other Ambulatory Visit: Payer: Self-pay | Admitting: Internal Medicine

## 2023-01-21 DIAGNOSIS — R0789 Other chest pain: Secondary | ICD-10-CM

## 2023-02-01 ENCOUNTER — Ambulatory Visit (INDEPENDENT_AMBULATORY_CARE_PROVIDER_SITE_OTHER): Payer: Medicare HMO | Admitting: Internal Medicine

## 2023-02-01 ENCOUNTER — Encounter: Payer: Self-pay | Admitting: Internal Medicine

## 2023-02-01 VITALS — BP 122/80 | HR 92 | Ht 63.0 in | Wt 201.2 lb

## 2023-02-01 DIAGNOSIS — R7303 Prediabetes: Secondary | ICD-10-CM

## 2023-02-01 DIAGNOSIS — Z860100 Personal history of colon polyps, unspecified: Secondary | ICD-10-CM | POA: Insufficient documentation

## 2023-02-01 DIAGNOSIS — M1A09X0 Idiopathic chronic gout, multiple sites, without tophus (tophi): Secondary | ICD-10-CM | POA: Diagnosis not present

## 2023-02-01 DIAGNOSIS — G4733 Obstructive sleep apnea (adult) (pediatric): Secondary | ICD-10-CM | POA: Diagnosis not present

## 2023-02-01 DIAGNOSIS — I10 Essential (primary) hypertension: Secondary | ICD-10-CM | POA: Diagnosis not present

## 2023-02-01 DIAGNOSIS — E782 Mixed hyperlipidemia: Secondary | ICD-10-CM

## 2023-02-01 NOTE — Progress Notes (Signed)
Established Patient Office Visit  Subjective:  Patient ID: Mike Barnes, male    DOB: [birth year]  Age: 67 y.o. MRN: [id number]  Chief Complaint  Patient presents with   Follow-up    1 month follow up    Patient comes in for follow-up today.  His blood pressure is looking better.  He has now gone back to a whole tablet of Zestril 5 mg/day. Patient reports of recurrent heartburn.  He also has a history of colon polyps.  His last colonoscopy was done at [facility].  Will send in a referral as he may need colonoscopy along with EGD. Patient admits to loud snoring at night, nonrestorative sleep, and daytime fatigue.  Will  schedule a sleep study.    No other concerns at this time.   Past Medical History:  Diagnosis Date   GERD (gastroesophageal reflux disease)    Hyperlipidemia    Left sided sciatica 09/24/2022   Spinal stenosis of lumbar region with radiculopathy 09/24/2022    Past Surgical History:  Procedure Laterality Date   left shoulder surgery     LUMBAR LAMINECTOMY Left    SPINE SURGERY      Social History   Socioeconomic History   Marital status: Married    Spouse name: Not on file   Number of children: Not on file   Years of education: Not on file   Highest education level: Not on file  Occupational History   Not on file  Tobacco Use   Smoking status: Former    Types: Cigarettes   Smokeless tobacco: Never  Substance and Sexual Activity   Alcohol use: Not Currently   Drug use: Never   Sexual activity: Not on file  Other Topics Concern   Not on file  Social History Narrative   Not on file   Social Determinants of Health   Financial Resource Strain: Not on file  Food Insecurity: Not on file  Transportation Needs: Not on file  Physical Activity: Not on file  Stress: Not on file  Social Connections: Not on file  Intimate Partner Violence: Not on file    Family History  Problem Relation Age of Onset   Hypertension Mother    Gout Father      No Known Allergies  Review of Systems  Constitutional:  Positive for malaise/fatigue.  HENT: Negative.  Negative for congestion and sore throat.   Eyes: Negative.   Respiratory: Negative.  Negative for cough, shortness of breath and stridor.   Cardiovascular: Negative.  Negative for chest pain, palpitations and leg swelling.  Gastrointestinal: Negative.  Negative for abdominal pain, constipation, diarrhea, heartburn, nausea and vomiting.  Genitourinary: Negative.  Negative for dysuria and flank pain.  Musculoskeletal: Negative.  Negative for joint pain and myalgias.  Skin: Negative.   Neurological: Negative.  Negative for dizziness and headaches.  Endo/Heme/Allergies: Negative.   Psychiatric/Behavioral: Negative.  Negative for depression and suicidal ideas. The patient is not nervous/anxious.        Objective:   BP 122/80   Pulse 92   Ht 5\' 3"  (1.6 m)   Wt 201 lb 3.2 oz (91.3 kg)   SpO2 95%   BMI 35.64 kg/m   Vitals:   02/01/23 1012  BP: 122/80  Pulse: 92  Height: 5\' 3"  (1.6 m)  Weight: 201 lb 3.2 oz (91.3 kg)  SpO2: 95%  BMI (Calculated): 35.65    Physical Exam Vitals and nursing note reviewed.  Constitutional:  Appearance: Mike appearance.  HENT:     Head: Normocephalic and atraumatic.     Nose: Nose Mike.     Mouth/Throat:     Mouth: Mucous membranes are moist.     Pharynx: Oropharynx is clear.  Eyes:     Conjunctiva/sclera: Conjunctivae Mike.     Pupils: Pupils are equal, round, and reactive to light.  Cardiovascular:     Rate and Rhythm: Mike rate and regular rhythm.     Pulses: Mike pulses.     Heart sounds: Mike heart sounds.  Pulmonary:     Effort: Pulmonary effort is Mike.     Breath sounds: Mike breath sounds. No wheezing, rhonchi or rales.  Abdominal:     General: Bowel sounds are Mike. There is no distension.     Palpations: Abdomen is soft. There is no mass.     Tenderness: There is no abdominal tenderness. There  is no guarding or rebound.     Hernia: No hernia is present.  Musculoskeletal:        General: Mike range of motion.     Cervical back: Mike range of motion.  Skin:    General: Skin is warm and dry.  Neurological:     General: No focal deficit present.     Mental Status: He is alert and oriented to person, place, and time.  Psychiatric:        Mood and Affect: Mood Mike.        Behavior: Behavior Mike.        Judgment: Judgment Mike.      No results found for any visits on 02/01/23.      Assessment & Plan:  Patient advised to continue taking all his medications as such.  Set up GI and sleep study referrals. Problem List Items Addressed This Visit     Idiopathic chronic gout of multiple sites without tophus   Mixed hyperlipidemia   Essential hypertension, benign - Primary   Prediabetes   Personal history of colonic polyps   Relevant Orders   Ambulatory referral to Gastroenterology   Other Visit Diagnoses     OSA (obstructive sleep apnea)       Relevant Orders   Ambulatory referral to Sleep Studies       Return in about 2 months (around 04/04/2023).   Total time spent: 30 minutes  Margaretann Loveless, MD  02/01/2023   This document may have been prepared by [facility] Voice Recognition software and as such may include unintentional dictation errors.

## 2023-02-18 ENCOUNTER — Telehealth: Payer: Self-pay | Admitting: Internal Medicine

## 2023-02-18 NOTE — Telephone Encounter (Signed)
Alvino Chapel at [facility] called for clarification of the referral that we sent. Is this referral for a procedure or for a clinic appointment?   Callback # [phone] press 1 ask for Miss Alvino Chapel Fax # [phone] attn: Dorthy Cooler

## 2023-02-22 NOTE — Progress Notes (Unsigned)
Sleep Medicine   Office Visit  Patient Name: Mike Barnes DOB: 24-Aug-1955 MRN [id number]    Chief Complaint: ***  Brief History:  Mike Barnes presents for an initial consult for sleep evaluation and to establish care. Patient has a *** history of ***. Sleep quality is ***. This is noted *** nights. The patient's bed partner reports  *** at night. The patient relates the following symptoms: *** are also present. The patient goes to sleep at *** and wakes up at ***.  Sleep quality is *** when outside home environment.  Patient has noted *** of his legs at night that would disrupt his sleep.  The patient  relates *** unusual behavior during the night.  The patient relates *** a history of psychiatric problems. The Epworth Sleepiness Score is *** out of 24 .  The patient relates  Cardiovascular risk factors include: *** The patient reports ***    ROS  General: (-) fever, (-) chills, (-) night sweat Nose and Sinuses: (-) nasal stuffiness or itchiness, (-) postnasal drip, (-) nosebleeds, (-) sinus trouble. Mouth and Throat: (-) sore throat, (-) hoarseness. Neck: (-) swollen glands, (-) enlarged thyroid, (-) neck pain. Respiratory: *** cough, *** shortness of breath, *** wheezing. Neurologic: *** numbness, *** tingling. Psychiatric: *** anxiety, *** depression Sleep behavior: ***sleep paralysis ***hypnogogic hallucinations ***dream enactment      ***vivid dreams ***cataplexy ***night terrors ***sleep walking   Current Medication: Outpatient Encounter Medications as of 02/23/2023  Medication Sig   allopurinol (ZYLOPRIM) 100 MG tablet TAKE 1 TABLET BY MOUTH TWICE DAILY   colchicine 0.6 MG tablet Take 1 tablet by mouth 2 (two) times daily as needed.   famotidine (PEPCID) 40 MG tablet TAKE 1 TABLET BY MOUTH EVERY DAY   gabapentin (NEURONTIN) 300 MG capsule TAKE 1 CAPSULE BY MOUTH AT BEDTIME FOR 3 DAYS. INCREASE TO 2 TIMES DAILY FOR 3 DAYS, THEN. INCREASE TO 3 TIMES DAILY   lisinopril  (ZESTRIL) 5 MG tablet Take 1 tablet (5 mg total) by mouth daily.   meloxicam (MOBIC) 15 MG tablet TAKE 1 TABLET BY MOUTH EVERY DAY   pantoprazole (PROTONIX) 40 MG tablet Take 1 tablet by mouth daily.   rosuvastatin (CRESTOR) 10 MG tablet Take 1 tablet by mouth daily.   No facility-administered encounter medications on file as of 02/23/2023.    Surgical History: Past Surgical History:  Procedure Laterality Date   left shoulder surgery     LUMBAR LAMINECTOMY Left    SPINE SURGERY      Medical History: Past Medical History:  Diagnosis Date   GERD (gastroesophageal reflux disease)    Hyperlipidemia    Left sided sciatica 09/24/2022   Spinal stenosis of lumbar region with radiculopathy 09/24/2022    Family History: Non contributory to the present illness  Social History: Social History   Socioeconomic History   Marital status: Married    Spouse name: Not on file   Number of children: Not on file   Years of education: Not on file   Highest education level: Not on file  Occupational History   Not on file  Tobacco Use   Smoking status: Former    Types: Cigarettes   Smokeless tobacco: Never  Substance and Sexual Activity   Alcohol use: Not Currently   Drug use: Never   Sexual activity: Not on file  Other Topics Concern   Not on file  Social History Narrative   Not on file   Social Determinants of Health   Financial  Resource Strain: Not on file  Food Insecurity: Not on file  Transportation Needs: Not on file  Physical Activity: Not on file  Stress: Not on file  Social Connections: Not on file  Intimate Partner Violence: Not on file    Vital Signs: There were no vitals taken for this visit. There is no height or weight on file to calculate BMI.   Examination: General Appearance: The patient is well-developed, well-nourished, and in no distress. Neck Circumference: *** Skin: Gross inspection of skin unremarkable. Head: normocephalic, no gross  deformities. Eyes: no gross deformities noted. ENT: ears appear grossly normal Neurologic: Alert and oriented. No involuntary movements.    STOP BANG RISK ASSESSMENT S (snore) Have you been told that you snore?     YES   T (tired) Are you often tired, fatigued, or sleepy during the day?   YES  O (obstruction) Do you stop breathing, choke, or gasp during sleep? YES   P (pressure) Do you have or are you being treated for high blood pressure? YES/NO   B (BMI) Is your body index greater than 35 kg/m? YES/NO   A (age) Are you 65 years old or older? YES   N (neck) Do you have a neck circumference greater than 16 inches?   YES/NO   G (gender) Are you a male? YES   TOTAL STOP/BANG "YES" ANSWERS                                                                A STOP-Bang score of 2 or less is considered low risk, and a score of 5 or more is high risk for having either moderate or severe OSA. For people who score 3 or 4, doctors may need to perform further assessment to determine how likely they are to have OSA.         EPWORTH SLEEPINESS SCALE:  Scale:  (0)= no chance of dozing; (1)= slight chance of dozing; (2)= moderate chance of dozing; (3)= high chance of dozing  Chance  Situtation    Sitting and reading: ***    Watching TV: ***    Sitting Inactive in public: ***    As a passenger in car: ***      Lying down to rest: ***    Sitting and talking: ***    Sitting quielty after lunch: ***    In a car, stopped in traffic: ***   TOTAL SCORE:   *** out of 24    SLEEP STUDIES:  None   LABS: Recent Results (from the past 2160 hour(s))  CMP14+EGFR     Status: Abnormal   Collection Time: 12/24/22 10:21 AM  Result Value Ref Range   Glucose 110 (H) 70 - 99 mg/dL   BUN 18 8 - 27 mg/dL   Creatinine, Ser 2.13 0.76 - 1.27 mg/dL   eGFR 83 >08 MV/HQI/6.96   BUN/Creatinine Ratio 18 10 - 24   Sodium 138 134 - 144 mmol/L   Potassium 4.6 3.5 - 5.2 mmol/L   Chloride 98 96  - 106 mmol/L   CO2 25 20 - 29 mmol/L   Calcium 10.2 8.6 - 10.2 mg/dL   Total Protein 7.3 6.0 - 8.5 g/dL   Albumin 4.8 3.9 - 4.9 g/dL  Globulin, Total 2.5 1.5 - 4.5 g/dL   Albumin/Globulin Ratio 1.9 1.2 - 2.2   Bilirubin Total 0.6 0.0 - 1.2 mg/dL   Alkaline Phosphatase 76 44 - 121 IU/L   AST 33 0 - 40 IU/L   ALT 28 0 - 44 IU/L  CBC With Differential     Status: None   Collection Time: 12/24/22 10:21 AM  Result Value Ref Range   WBC 6.4 3.4 - 10.8 x10E3/uL   RBC 4.68 4.14 - 5.80 x10E6/uL   Hemoglobin 15.3 13.0 - 17.7 g/dL   Hematocrit 65.7 84.6 - 51.0 %   MCV 95 79 - 97 fL   MCH 32.7 26.6 - 33.0 pg   MCHC 34.3 31.5 - 35.7 g/dL   RDW 96.2 95.2 - 84.1 %   Neutrophils 43 Not Estab. %   Lymphs 42 Not Estab. %   Monocytes 9 Not Estab. %   Eos 5 Not Estab. %   Basos 1 Not Estab. %   Neutrophils Absolute 2.8 1.4 - 7.0 x10E3/uL   Lymphocytes Absolute 2.7 0.7 - 3.1 x10E3/uL   Monocytes Absolute 0.6 0.1 - 0.9 x10E3/uL   EOS (ABSOLUTE) 0.4 0.0 - 0.4 x10E3/uL   Basophils Absolute 0.1 0.0 - 0.2 x10E3/uL   Immature Granulocytes 0 Not Estab. %   Immature Grans (Abs) 0.0 0.0 - 0.1 x10E3/uL  Lipid Panel w/o Chol/HDL Ratio     Status: Abnormal   Collection Time: 12/24/22 10:21 AM  Result Value Ref Range   Cholesterol, Total 163 100 - 199 mg/dL   Triglycerides 324 (H) 0 - 149 mg/dL   HDL 60 >40 mg/dL   VLDL Cholesterol Cal 30 5 - 40 mg/dL   LDL Chol Calc (NIH) 73 0 - 99 mg/dL  Hemoglobin N0U     Status: Abnormal   Collection Time: 12/24/22 10:21 AM  Result Value Ref Range   Hgb A1c MFr Bld 6.2 (H) 4.8 - 5.6 %    Comment:          Prediabetes: 5.7 - 6.4          Diabetes: >6.4          Glycemic control for adults with diabetes: <7.0    Est. average glucose Bld gHb Est-mCnc 131 mg/dL    Radiology: MR CERVICAL SPINE WO CONTRAST  Result Date: 12/23/2022 CLINICAL DATA:  Paresthesia, numbness and pain in the hands, feet and legs. EXAM: MRI CERVICAL SPINE WITHOUT CONTRAST TECHNIQUE:  Multiplanar, multisequence MR imaging of the cervical spine was performed. No intravenous contrast was administered. COMPARISON:  None Available. FINDINGS: Alignment: No malalignment. Vertebrae: No fracture or focal bone lesion. No edematous facet arthritis. Cord: No primary cord lesion.  See below regarding stenosis. Posterior Fossa, vertebral arteries, paraspinal tissues: Negative Disc levels: Foramen magnum, C1-2 and C2-3 are normal. C3-4: Small central disc bulge. No compressive narrowing of the canal or foramina. AP diameter of the canal in the midline 10 mm. C4-5: Bulging of the disc. Narrowing of the ventral subarachnoid space but no compression of the cord. AP diameter of the canal in the midline 8.1 mm. C5-6: Bulging of the disc. Narrowing of the ventral subarachnoid space but no compression of the cord. AP diameter of the canal in the midline 7.6 mm. Moderate bilateral foraminal stenosis. C6-7: Bulging of the disc. Narrowing of the ventral subarachnoid space with slight indentation of the cord. AP diameter of the canal in the midline 6.4 mm. Moderate bilateral foraminal stenosis. C7-T1: Mild bulging of  the disc. AP diameter of the canal in the midline 9.4 mm. No compressive effect upon the cord. No foraminal stenosis. IMPRESSION: 1. Degenerative spondylosis from C3-4 through C7-T1. Canal narrowing at C4-5, C5-6 and C6-7 with AP diameter of the canal as narrow as 6.4 mm at C6-7 and 7.6 mm at C5-6. Slight indentation of the cord at C6-7 but no abnormal cord signal. 2. Foraminal narrowing that could possibly be symptomatic at C5-6 and C6-7. Electronically Signed   By: Paulina Fusi M.D.   On: 12/23/2022 16:20    No results found.  No results found.    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  Suggest: *** to assess/treat the patient's sleep disordered breathing. The patient was also counselled on *** to optimize sleep health.  PLAN hypersomnia:  Patient evaluation suggests significant daytime hypersomnia.  The Epworth Sleepiness Score is elevated at *** out of 24. Patient *** drowsy driving. The patient *** MVA due to sleepiness.  The patient *** restless leg symptoms which exacerbate *** for *** nights per week. The patient *** periodic limb movements which exacerbate ***  for *** nights per week. Suggest: ***  Also suggest ***  PLAN insomnia:  Patient evaluation suggests *** insomnia. This is a chronic disorder. This has been a concern for *** and causes impaired daytime functioning. The patient exhibits comorbid ***  The history *** suggest the insomnia predates the use of hypnotic medications. The symptoms *** with the discontinuation of these medications. There is no obvious medical, psychiatric or pharmacologic abuse issues ot account for the insomnia.  Treatment recommendations include: *** The patient should maintain a sleep log and calculate total sleep time for 1-2 weeks. Set bed and wake times for achieve 85% sleep efficiency for one week. Once this is achieved  time in bed can be gradually increased. A pharmacologic treatment approach would include a trial of *** for the next ***  months. During this time the patient is to maintain a sleep diary to track progress.    ***  General  Counseling: I have discussed the findings of the evaluation and examination with Mike Barnes.  I have also discussed any further diagnostic evaluation thatmay be needed or ordered today. Mike Barnes verbalizes understanding of the findings of todays visit. We also reviewed his medications today and discussed drug interactions and side effects including but not limited excessive drowsiness and altered mental states. We also discussed that there is always a risk not just to him but also people around him. he has been encouraged to call the office with any questions or concerns that should arise related to todays visit.  No orders of the defined types were placed in this encounter.       I have personally obtained a history, evaluated the patient, evaluated pertinent data, formulated the assessment and plan and placed orders.    Mike Barnes  Crista Luria, MD [facility] Diplomate ABMS Pulmonary and Critical Care Medicine Sleep medicine

## 2023-02-23 ENCOUNTER — Ambulatory Visit: Payer: Medicare HMO | Admitting: Internal Medicine

## 2023-02-23 DIAGNOSIS — Z91199 Patient's noncompliance with other medical treatment and regimen due to unspecified reason: Secondary | ICD-10-CM

## 2023-03-16 DIAGNOSIS — H524 Presbyopia: Secondary | ICD-10-CM | POA: Diagnosis not present

## 2023-03-16 DIAGNOSIS — H52209 Unspecified astigmatism, unspecified eye: Secondary | ICD-10-CM | POA: Diagnosis not present

## 2023-03-16 DIAGNOSIS — H5203 Hypermetropia, bilateral: Secondary | ICD-10-CM | POA: Diagnosis not present

## 2023-03-23 DIAGNOSIS — G8929 Other chronic pain: Secondary | ICD-10-CM | POA: Diagnosis not present

## 2023-03-23 DIAGNOSIS — R4189 Other symptoms and signs involving cognitive functions and awareness: Secondary | ICD-10-CM | POA: Diagnosis not present

## 2023-03-23 DIAGNOSIS — R202 Paresthesia of skin: Secondary | ICD-10-CM | POA: Diagnosis not present

## 2023-03-23 DIAGNOSIS — M545 Low back pain, unspecified: Secondary | ICD-10-CM | POA: Diagnosis not present

## 2023-03-23 DIAGNOSIS — R109 Unspecified abdominal pain: Secondary | ICD-10-CM | POA: Diagnosis not present

## 2023-03-26 DIAGNOSIS — M542 Cervicalgia: Secondary | ICD-10-CM | POA: Diagnosis not present

## 2023-03-26 DIAGNOSIS — R202 Paresthesia of skin: Secondary | ICD-10-CM | POA: Diagnosis not present

## 2023-04-05 ENCOUNTER — Encounter: Payer: Self-pay | Admitting: Internal Medicine

## 2023-04-05 ENCOUNTER — Ambulatory Visit (INDEPENDENT_AMBULATORY_CARE_PROVIDER_SITE_OTHER): Payer: Medicare HMO | Admitting: Internal Medicine

## 2023-04-05 VITALS — BP 130/84 | HR 86 | Ht 63.0 in | Wt 200.2 lb

## 2023-04-05 DIAGNOSIS — K219 Gastro-esophageal reflux disease without esophagitis: Secondary | ICD-10-CM | POA: Diagnosis not present

## 2023-04-05 DIAGNOSIS — J069 Acute upper respiratory infection, unspecified: Secondary | ICD-10-CM | POA: Diagnosis not present

## 2023-04-05 DIAGNOSIS — M1A09X0 Idiopathic chronic gout, multiple sites, without tophus (tophi): Secondary | ICD-10-CM

## 2023-04-05 DIAGNOSIS — E782 Mixed hyperlipidemia: Secondary | ICD-10-CM

## 2023-04-05 DIAGNOSIS — R7303 Prediabetes: Secondary | ICD-10-CM

## 2023-04-05 DIAGNOSIS — I10 Essential (primary) hypertension: Secondary | ICD-10-CM

## 2023-04-05 LAB — POCT XPERT XPRESS SARS COVID-2/FLU/RSV
FLU A: NEGATIVE
FLU B: NEGATIVE
RSV RNA, PCR: NEGATIVE
SARS Coronavirus 2: NEGATIVE

## 2023-04-05 MED ORDER — AMOXICILLIN-POT CLAVULANATE 875-125 MG PO TABS
1.0000 | ORAL_TABLET | Freq: Two times a day (BID) | ORAL | 0 refills | Status: DC
Start: 2023-04-05 — End: 2023-04-16

## 2023-04-05 NOTE — Progress Notes (Signed)
Established Patient Office Visit  Subjective:  Patient ID: Mike Barnes, male    DOB: 26-Jul-1956  Age: 67 y.o. MRN: [id number]  Chief Complaint  Patient presents with   Follow-up    2 month follow up    Patient comes in with 2-day history of sore throat, nasal and sinus congestion.  States his wife was sick last week with similar symptoms.  He complains of headache, sinus pressure and has thick nasal discharge.  Feels tired and feverish. COVID test done in the office is negative. Will send a prescription for Augmentin. Patient is also due for blood work. He has not seen a GI yet.    No other concerns at this time.   Past Medical History:  Diagnosis Date   GERD (gastroesophageal reflux disease)    Hyperlipidemia    Left sided sciatica 09/24/2022   Spinal stenosis of lumbar region with radiculopathy 09/24/2022    Past Surgical History:  Procedure Laterality Date   left shoulder surgery     LUMBAR LAMINECTOMY Left    SPINE SURGERY      Social History   Socioeconomic History   Marital status: Married    Spouse name: Not on file   Number of children: Not on file   Years of education: Not on file   Highest education level: Not on file  Occupational History   Not on file  Tobacco Use   Smoking status: Former    Types: Cigarettes   Smokeless tobacco: Never  Substance and Sexual Activity   Alcohol use: Not Currently   Drug use: Never   Sexual activity: Not on file  Other Topics Concern   Not on file  Social History Narrative   Not on file   Social Determinants of Health   Financial Resource Strain: Not on file  Food Insecurity: Not on file  Transportation Needs: Not on file  Physical Activity: Not on file  Stress: Not on file  Social Connections: Not on file  Intimate Partner Violence: Not on file    Family History  Problem Relation Age of Onset   Hypertension Mother    Gout Father     No Known Allergies  Review of Systems  Constitutional:   Positive for chills and malaise/fatigue. Negative for diaphoresis and fever.  HENT:  Positive for congestion, sinus pain and sore throat.   Eyes: Negative.   Respiratory:  Positive for cough. Negative for shortness of breath.   Cardiovascular: Negative.  Negative for chest pain, palpitations and leg swelling.  Gastrointestinal: Negative.  Negative for abdominal pain, constipation, diarrhea, heartburn, nausea and vomiting.  Genitourinary: Negative.  Negative for dysuria and flank pain.  Musculoskeletal: Negative.  Negative for joint pain and myalgias.  Skin: Negative.   Neurological: Negative.  Negative for dizziness and headaches.  Endo/Heme/Allergies: Negative.   Psychiatric/Behavioral: Negative.  Negative for depression and suicidal ideas. The patient is not nervous/anxious.        Objective:   BP 130/84   Pulse 86   Ht 5\' 3"  (1.6 m)   Wt 200 lb 3.2 oz (90.8 kg)   SpO2 96%   BMI 35.46 kg/m   Vitals:   04/05/23 1015  BP: 130/84  Pulse: 86  Height: 5\' 3"  (1.6 m)  Weight: 200 lb 3.2 oz (90.8 kg)  SpO2: 96%  BMI (Calculated): 35.47    Physical Exam Vitals and nursing note reviewed.  Constitutional:      General: He is not in acute distress.  Appearance: Normal appearance.  HENT:     Head: Normocephalic and atraumatic.     Nose: Nose normal.     Mouth/Throat:     Mouth: Mucous membranes are moist.     Pharynx: Oropharynx is clear.  Eyes:     Conjunctiva/sclera: Conjunctivae normal.     Pupils: Pupils are equal, round, and reactive to light.  Cardiovascular:     Rate and Rhythm: Normal rate and regular rhythm.     Pulses: Normal pulses.     Heart sounds: Normal heart sounds.  Pulmonary:     Effort: Pulmonary effort is normal.     Breath sounds: Normal breath sounds. No wheezing, rhonchi or rales.  Abdominal:     General: Bowel sounds are normal.     Palpations: Abdomen is soft. There is no mass.     Tenderness: There is no abdominal tenderness. There is no  right CVA tenderness, left CVA tenderness, guarding or rebound.     Hernia: No hernia is present.  Musculoskeletal:        General: Normal range of motion.     Cervical back: Normal range of motion.     Right lower leg: No edema.     Left lower leg: No edema.  Skin:    General: Skin is warm and dry.     Findings: No rash.  Neurological:     General: No focal deficit present.     Mental Status: He is alert and oriented to person, place, and time.  Psychiatric:        Mood and Affect: Mood normal.        Behavior: Behavior normal.        Judgment: Judgment normal.      No results found for any visits on 04/05/23.  No results found for this or any previous visit (from the past 2160 hour(s)).    Assessment & Plan:  Labs today.  Continue medications.  Augmentin for 1 week. Problem List Items Addressed This Visit     Idiopathic chronic gout of multiple sites without tophus   Relevant Orders   CBC With Differential   Uric acid   Mixed hyperlipidemia   Relevant Orders   Lipid Panel w/o Chol/HDL Ratio   CMP14+EGFR   Gastroesophageal reflux disease without esophagitis   Essential hypertension, benign - Primary   Prediabetes   Relevant Orders   Hemoglobin A1c   Other Visit Diagnoses     Upper respiratory tract infection, unspecified type       Relevant Medications   amoxicillin-clavulanate (AUGMENTIN) 875-125 MG tablet   Other Relevant Orders   POCT XPERT XPRESS SARS COVID-2/FLU/RSV       Return in about 10 days (around 04/15/2023).   Total time spent: 30 minutes  Margaretann Loveless, MD  04/05/2023   This document may have been prepared by [facility] Voice Recognition software and as such may include unintentional dictation errors.

## 2023-04-06 LAB — CBC WITH DIFFERENTIAL/PLATELET
Basophils Absolute: 0 10*3/uL (ref 0.0–0.2)
Basos: 1 %
EOS (ABSOLUTE): 0.4 10*3/uL (ref 0.0–0.4)
Eos: 6 %
Hematocrit: 42 % (ref 37.5–51.0)
Hemoglobin: 13.8 g/dL (ref 13.0–17.7)
Immature Grans (Abs): 0 10*3/uL (ref 0.0–0.1)
Immature Granulocytes: 0 %
Lymphocytes Absolute: 1.4 10*3/uL (ref 0.7–3.1)
Lymphs: 22 %
MCH: 33 pg (ref 26.6–33.0)
MCHC: 32.9 g/dL (ref 31.5–35.7)
MCV: 101 fL — ABNORMAL HIGH (ref 79–97)
Monocytes Absolute: 0.6 10*3/uL (ref 0.1–0.9)
Monocytes: 9 %
Neutrophils Absolute: 4.1 10*3/uL (ref 1.4–7.0)
Neutrophils: 62 %
Platelets: 148 10*3/uL — ABNORMAL LOW (ref 150–450)
RBC: 4.18 x10E6/uL (ref 4.14–5.80)
RDW: 12.8 % (ref 11.6–15.4)
WBC: 6.5 10*3/uL (ref 3.4–10.8)

## 2023-04-06 LAB — CMP14+EGFR
ALT: 22 IU/L (ref 0–44)
AST: 19 IU/L (ref 0–40)
Albumin: 4.5 g/dL (ref 3.9–4.9)
Alkaline Phosphatase: 73 IU/L (ref 44–121)
BUN/Creatinine Ratio: 18 (ref 10–24)
BUN: 20 mg/dL (ref 8–27)
Bilirubin Total: 0.4 mg/dL (ref 0.0–1.2)
CO2: 25 mmol/L (ref 20–29)
Calcium: 9.5 mg/dL (ref 8.6–10.2)
Chloride: 101 mmol/L (ref 96–106)
Creatinine, Ser: 1.12 mg/dL (ref 0.76–1.27)
Globulin, Total: 2.2 g/dL (ref 1.5–4.5)
Glucose: 255 mg/dL — ABNORMAL HIGH (ref 70–99)
Potassium: 4.4 mmol/L (ref 3.5–5.2)
Sodium: 139 mmol/L (ref 134–144)
Total Protein: 6.7 g/dL (ref 6.0–8.5)
eGFR: 72 mL/min/{1.73_m2} (ref >59)

## 2023-04-06 LAB — HEMOGLOBIN A1C
Est. average glucose Bld gHb Est-mCnc: 123 mg/dL
Hgb A1c MFr Bld: 5.9 % — ABNORMAL HIGH (ref 4.8–5.6)

## 2023-04-06 LAB — LIPID PANEL W/O CHOL/HDL RATIO
Cholesterol, Total: 140 mg/dL (ref 100–199)
HDL: 54 mg/dL (ref >39)
LDL Chol Calc (NIH): 61 mg/dL (ref 0–99)
Triglycerides: 148 mg/dL (ref 0–149)
VLDL Cholesterol Cal: 25 mg/dL (ref 5–40)

## 2023-04-06 LAB — URIC ACID: Uric Acid: 7.2 mg/dL (ref 3.8–8.4)

## 2023-04-15 ENCOUNTER — Ambulatory Visit: Payer: Medicare HMO | Admitting: Internal Medicine

## 2023-04-16 ENCOUNTER — Encounter: Payer: Self-pay | Admitting: Internal Medicine

## 2023-04-16 ENCOUNTER — Ambulatory Visit (INDEPENDENT_AMBULATORY_CARE_PROVIDER_SITE_OTHER): Payer: Medicare HMO | Admitting: Internal Medicine

## 2023-04-16 VITALS — BP 130/90 | HR 81 | Ht 63.0 in | Wt 195.0 lb

## 2023-04-16 DIAGNOSIS — R7303 Prediabetes: Secondary | ICD-10-CM

## 2023-04-16 DIAGNOSIS — M1A09X0 Idiopathic chronic gout, multiple sites, without tophus (tophi): Secondary | ICD-10-CM | POA: Diagnosis not present

## 2023-04-16 DIAGNOSIS — E782 Mixed hyperlipidemia: Secondary | ICD-10-CM | POA: Diagnosis not present

## 2023-04-16 DIAGNOSIS — I10 Essential (primary) hypertension: Secondary | ICD-10-CM | POA: Diagnosis not present

## 2023-04-16 MED ORDER — AMLODIPINE BESYLATE 5 MG PO TABS
5.0000 mg | ORAL_TABLET | Freq: Every day | ORAL | 11 refills | Status: DC
Start: 2023-04-16 — End: 2023-10-28

## 2023-04-16 NOTE — Progress Notes (Signed)
Established Patient Office Visit  Subjective:  Patient ID: Mike Barnes, male    DOB: 20-Apr-1956  Age: 67 y.o. MRN: [id number]  Chief Complaint  Patient presents with   Follow-up    10 day follow up    Patient comes in for follow-up.  He has completed his Augmentin for the URI, and is feeling much better. However he mentions a dry annoying cough, a tickle in his throat since he was started on Zestril 5 mg for blood pressure control. Will switch to amlodipine 5 mg.    No other concerns at this time.   Past Medical History:  Diagnosis Date   GERD (gastroesophageal reflux disease)    Hyperlipidemia    Left sided sciatica 09/24/2022   Spinal stenosis of lumbar region with radiculopathy 09/24/2022    Past Surgical History:  Procedure Laterality Date   left shoulder surgery     LUMBAR LAMINECTOMY Left    SPINE SURGERY      Social History   Socioeconomic History   Marital status: Married    Spouse name: Not on file   Number of children: Not on file   Years of education: Not on file   Highest education level: Not on file  Occupational History   Not on file  Tobacco Use   Smoking status: Former    Types: Cigarettes   Smokeless tobacco: Never  Substance and Sexual Activity   Alcohol use: Not Currently   Drug use: Never   Sexual activity: Not on file  Other Topics Concern   Not on file  Social History Narrative   Not on file   Social Determinants of Health   Financial Resource Strain: Not on file  Food Insecurity: Not on file  Transportation Needs: Not on file  Physical Activity: Not on file  Stress: Not on file  Social Connections: Not on file  Intimate Partner Violence: Not on file    Family History  Problem Relation Age of Onset   Hypertension Mother    Gout Father     No Known Allergies  Review of Systems  Constitutional: Negative.  Negative for chills, fever, malaise/fatigue and weight loss.  HENT: Negative.  Negative for congestion,  sinus pain and sore throat.   Eyes: Negative.   Respiratory:  Positive for cough. Negative for hemoptysis, sputum production, shortness of breath and wheezing.   Cardiovascular: Negative.  Negative for chest pain, palpitations and leg swelling.  Gastrointestinal: Negative.  Negative for abdominal pain, constipation, diarrhea, heartburn, nausea and vomiting.  Genitourinary: Negative.  Negative for dysuria and flank pain.  Musculoskeletal: Negative.  Negative for joint pain and myalgias.  Neurological: Negative.  Negative for dizziness and headaches.  Endo/Heme/Allergies: Negative.   Psychiatric/Behavioral: Negative.  Negative for depression and suicidal ideas. The patient is not nervous/anxious.        Objective:   BP (!) 130/90   Pulse 81   Ht 5\' 3"  (1.6 m)   Wt 195 lb (88.5 kg)   SpO2 98%   BMI 34.54 kg/m   Vitals:   04/16/23 1132  BP: (!) 130/90  Pulse: 81  Height: 5\' 3"  (1.6 m)  Weight: 195 lb (88.5 kg)  SpO2: 98%  BMI (Calculated): 34.55    Physical Exam Vitals and nursing note reviewed.  Constitutional:      Appearance: Normal appearance.  HENT:     Head: Normocephalic and atraumatic.     Nose: Nose normal.     Mouth/Throat:  Mouth: Mucous membranes are moist.     Pharynx: Oropharynx is clear.  Eyes:     Conjunctiva/sclera: Conjunctivae normal.     Pupils: Pupils are equal, round, and reactive to light.  Cardiovascular:     Rate and Rhythm: Normal rate and regular rhythm.     Pulses: Normal pulses.     Heart sounds: Normal heart sounds.  Pulmonary:     Effort: Pulmonary effort is normal.     Breath sounds: Normal breath sounds.  Abdominal:     General: Bowel sounds are normal.     Palpations: Abdomen is soft.  Musculoskeletal:        General: Normal range of motion.     Cervical back: Normal range of motion.  Skin:    General: Skin is warm and dry.  Neurological:     General: No focal deficit present.     Mental Status: He is alert and oriented  to person, place, and time.  Psychiatric:        Mood and Affect: Mood normal.        Behavior: Behavior normal.        Judgment: Judgment normal.      No results found for any visits on 04/16/23.  Recent Results (from the past 2160 hour(s))  Lipid Panel w/o Chol/HDL Ratio     Status: None   Collection Time: 04/05/23 10:42 AM  Result Value Ref Range   Cholesterol, Total 140 100 - 199 mg/dL   Triglycerides 629 0 - 149 mg/dL   HDL 54 >52 mg/dL   VLDL Cholesterol Cal 25 5 - 40 mg/dL   LDL Chol Calc (NIH) 61 0 - 99 mg/dL  Uric acid     Status: None   Collection Time: 04/05/23 10:42 AM  Result Value Ref Range   Uric Acid 7.2 3.8 - 8.4 mg/dL    Comment:            Therapeutic target for gout patients: <6.0  CMP14+EGFR     Status: Abnormal   Collection Time: 04/05/23 10:42 AM  Result Value Ref Range   Glucose 255 (H) 70 - 99 mg/dL   BUN 20 8 - 27 mg/dL   Creatinine, Ser 8.41 0.76 - 1.27 mg/dL   eGFR 72 >32 GM/WNU/2.72   BUN/Creatinine Ratio 18 10 - 24   Sodium 139 134 - 144 mmol/L   Potassium 4.4 3.5 - 5.2 mmol/L   Chloride 101 96 - 106 mmol/L   CO2 25 20 - 29 mmol/L   Calcium 9.5 8.6 - 10.2 mg/dL   Total Protein 6.7 6.0 - 8.5 g/dL   Albumin 4.5 3.9 - 4.9 g/dL   Globulin, Total 2.2 1.5 - 4.5 g/dL   Bilirubin Total 0.4 0.0 - 1.2 mg/dL   Alkaline Phosphatase 73 44 - 121 IU/L   AST 19 0 - 40 IU/L   ALT 22 0 - 44 IU/L  Hemoglobin A1c     Status: Abnormal   Collection Time: 04/05/23 10:42 AM  Result Value Ref Range   Hgb A1c MFr Bld 5.9 (H) 4.8 - 5.6 %    Comment:          Prediabetes: 5.7 - 6.4          Diabetes: >6.4          Glycemic control for adults with diabetes: <7.0    Est. average glucose Bld gHb Est-mCnc 123 mg/dL  CBC with Differential/Platelet     Status: Abnormal  Collection Time: 04/05/23 10:42 AM  Result Value Ref Range   WBC 6.5 3.4 - 10.8 x10E3/uL   RBC 4.18 4.14 - 5.80 x10E6/uL   Hemoglobin 13.8 13.0 - 17.7 g/dL   Hematocrit 65.7 84.6 - 51.0 %    MCV 101 (H) 79 - 97 fL   MCH 33.0 26.6 - 33.0 pg   MCHC 32.9 31.5 - 35.7 g/dL   RDW 96.2 95.2 - 84.1 %   Platelets 148 (L) 150 - 450 x10E3/uL   Neutrophils 62 Not Estab. %   Lymphs 22 Not Estab. %   Monocytes 9 Not Estab. %   Eos 6 Not Estab. %   Basos 1 Not Estab. %   Neutrophils Absolute 4.1 1.4 - 7.0 x10E3/uL   Lymphocytes Absolute 1.4 0.7 - 3.1 x10E3/uL   Monocytes Absolute 0.6 0.1 - 0.9 x10E3/uL   EOS (ABSOLUTE) 0.4 0.0 - 0.4 x10E3/uL   Basophils Absolute 0.0 0.0 - 0.2 x10E3/uL   Immature Granulocytes 0 Not Estab. %   Immature Grans (Abs) 0.0 0.0 - 0.1 x10E3/uL  POCT XPERT XPRESS SARS COVID-2/FLU/RSV     Status: None   Collection Time: 04/05/23 12:52 PM  Result Value Ref Range   SARS Coronavirus 2 neg    FLU A neg    FLU B neg    RSV RNA, PCR neg       Assessment & Plan:  Stop ACE inhibitor Zestril. Start Norvasc for blood pressure control.  Monitor blood pressure at home. Problem List Items Addressed This Visit     Idiopathic chronic gout of multiple sites without tophus   Mixed hyperlipidemia   Relevant Medications   amLODipine (NORVASC) 5 MG tablet   Essential hypertension, benign - Primary   Relevant Medications   amLODipine (NORVASC) 5 MG tablet   Prediabetes    Return in about 3 weeks (around 05/07/2023).   Total time spent: 25 minutes  Margaretann Loveless, MD  04/16/2023   This document may have been prepared by [facility] Voice Recognition software and as such may include unintentional dictation errors.

## 2023-04-21 ENCOUNTER — Other Ambulatory Visit: Payer: Self-pay | Admitting: Internal Medicine

## 2023-04-21 ENCOUNTER — Other Ambulatory Visit: Payer: Self-pay | Admitting: Cardiovascular Disease

## 2023-04-21 DIAGNOSIS — R0789 Other chest pain: Secondary | ICD-10-CM

## 2023-04-21 DIAGNOSIS — K219 Gastro-esophageal reflux disease without esophagitis: Secondary | ICD-10-CM

## 2023-05-03 NOTE — Progress Notes (Unsigned)
No show for appointment. Office will call to reschedule.  

## 2023-05-04 ENCOUNTER — Ambulatory Visit: Payer: Medicare HMO | Admitting: Internal Medicine

## 2023-05-04 DIAGNOSIS — Z91199 Patient's noncompliance with other medical treatment and regimen due to unspecified reason: Secondary | ICD-10-CM

## 2023-05-07 ENCOUNTER — Ambulatory Visit: Payer: Medicare HMO | Admitting: Internal Medicine

## 2023-05-23 ENCOUNTER — Other Ambulatory Visit: Payer: Self-pay | Admitting: Internal Medicine

## 2023-06-16 DIAGNOSIS — H2512 Age-related nuclear cataract, left eye: Secondary | ICD-10-CM | POA: Diagnosis not present

## 2023-06-29 ENCOUNTER — Encounter: Payer: Self-pay | Admitting: Internal Medicine

## 2023-06-29 ENCOUNTER — Ambulatory Visit (INDEPENDENT_AMBULATORY_CARE_PROVIDER_SITE_OTHER): Payer: Medicare Other | Admitting: Internal Medicine

## 2023-06-29 VITALS — BP 120/80 | HR 78 | Ht 63.0 in | Wt 203.6 lb

## 2023-06-29 DIAGNOSIS — R7303 Prediabetes: Secondary | ICD-10-CM | POA: Diagnosis not present

## 2023-06-29 DIAGNOSIS — I10 Essential (primary) hypertension: Secondary | ICD-10-CM | POA: Diagnosis not present

## 2023-06-29 DIAGNOSIS — R202 Paresthesia of skin: Secondary | ICD-10-CM | POA: Insufficient documentation

## 2023-06-29 DIAGNOSIS — M1A09X0 Idiopathic chronic gout, multiple sites, without tophus (tophi): Secondary | ICD-10-CM | POA: Diagnosis not present

## 2023-06-29 DIAGNOSIS — E782 Mixed hyperlipidemia: Secondary | ICD-10-CM

## 2023-06-29 DIAGNOSIS — M5416 Radiculopathy, lumbar region: Secondary | ICD-10-CM | POA: Diagnosis not present

## 2023-06-29 DIAGNOSIS — R0683 Snoring: Secondary | ICD-10-CM | POA: Diagnosis not present

## 2023-06-29 DIAGNOSIS — M48061 Spinal stenosis, lumbar region without neurogenic claudication: Secondary | ICD-10-CM | POA: Diagnosis not present

## 2023-06-29 DIAGNOSIS — G939 Disorder of brain, unspecified: Secondary | ICD-10-CM | POA: Diagnosis not present

## 2023-06-29 NOTE — Progress Notes (Signed)
Established Patient Office Visit  Subjective:  Patient ID: Mike Barnes, male    DOB: Sep 28, 1955  Age: 67 y.o. MRN: [id number]  Chief Complaint  Patient presents with   Acute Visit    Arm numbness    Patient comes in with complaints of left arm numbness and tingling.  He was actually just seen by the neurologist who have ordered and scheduled nerve conduction/EMG studies.  Patient has history of cervical spine disease.  He had difficulty understanding as to what the procedure was being scheduled for.  However now understands and will proceed with the nerve conduction study.     No other concerns at this time.   Past Medical History:  Diagnosis Date   GERD (gastroesophageal reflux disease)    Hyperlipidemia    Left sided sciatica 09/24/2022   Spinal stenosis of lumbar region with radiculopathy 09/24/2022    Past Surgical History:  Procedure Laterality Date   left shoulder surgery     LUMBAR LAMINECTOMY Left    SPINE SURGERY      Social History   Socioeconomic History   Marital status: Married    Spouse name: Not on file   Number of children: Not on file   Years of education: Not on file   Highest education level: Not on file  Occupational History   Not on file  Tobacco Use   Smoking status: Former    Types: Cigarettes   Smokeless tobacco: Never  Substance and Sexual Activity   Alcohol use: Not Currently   Drug use: Never   Sexual activity: Not on file  Other Topics Concern   Not on file  Social History Narrative   Not on file   Social Determinants of Health   Financial Resource Strain: Not on file  Food Insecurity: Not on file  Transportation Needs: Not on file  Physical Activity: Not on file  Stress: Not on file  Social Connections: Not on file  Intimate Partner Violence: Not on file    Family History  Problem Relation Age of Onset   Hypertension Mother    Gout Father     No Known Allergies  Outpatient Medications Prior to Visit   Medication Sig   allopurinol (ZYLOPRIM) 100 MG tablet TAKE 1 TABLET BY MOUTH TWICE DAILY   amLODipine (NORVASC) 5 MG tablet Take 1 tablet (5 mg total) by mouth daily.   colchicine 0.6 MG tablet Take 1 tablet by mouth 2 (two) times daily as needed.   famotidine (PEPCID) 40 MG tablet TAKE 1 TABLET BY MOUTH EVERY DAY   gabapentin (NEURONTIN) 300 MG capsule TAKE 1 CAPSULE BY MOUTH AT BEDTIME FOR 3 DAYS. INCREASE TO 2 TIMES DAILY FOR 3 DAYS, THEN. INCREASE TO 3 TIMES DAILY   pantoprazole (PROTONIX) 40 MG tablet TAKE 1 TABLET BY MOUTH EVERY DAY   rosuvastatin (CRESTOR) 10 MG tablet TAKE 1 TABLET BY MOUTH EVERY DAY   meloxicam (MOBIC) 15 MG tablet TAKE 1 TABLET BY MOUTH EVERY DAY (Patient not taking: Reported on 06/29/2023)   No facility-administered medications prior to visit.    Review of Systems  Constitutional: Negative.   HENT: Negative.    Eyes: Negative.   Respiratory: Negative.  Negative for cough and shortness of breath.   Cardiovascular: Negative.  Negative for chest pain, palpitations and leg swelling.  Gastrointestinal: Negative.  Negative for abdominal pain, constipation, diarrhea, heartburn, nausea and vomiting.  Genitourinary: Negative.  Negative for dysuria and flank pain.  Musculoskeletal: Negative.  Negative  for joint pain and myalgias.  Skin: Negative.   Neurological: Negative.  Negative for dizziness and headaches.  Endo/Heme/Allergies: Negative.   Psychiatric/Behavioral: Negative.  Negative for depression and suicidal ideas. The patient is not nervous/anxious.        Objective:   BP 120/80   Pulse 78   Ht 5\' 3"  (1.6 m)   Wt 203 lb 9.6 oz (92.4 kg)   SpO2 96%   BMI 36.07 kg/m   Vitals:   06/29/23 1310  BP: 120/80  Pulse: 78  Height: 5\' 3"  (1.6 m)  Weight: 203 lb 9.6 oz (92.4 kg)  SpO2: 96%  BMI (Calculated): 36.08    Physical Exam Vitals and nursing note reviewed.  Constitutional:      Appearance: Normal appearance.  HENT:     Head: Normocephalic  and atraumatic.     Nose: Nose normal.     Mouth/Throat:     Mouth: Mucous membranes are moist.     Pharynx: Oropharynx is clear.  Eyes:     Conjunctiva/sclera: Conjunctivae normal.     Pupils: Pupils are equal, round, and reactive to light.  Cardiovascular:     Rate and Rhythm: Normal rate and regular rhythm.     Pulses: Normal pulses.     Heart sounds: Normal heart sounds.  Pulmonary:     Effort: Pulmonary effort is normal.     Breath sounds: Normal breath sounds.  Abdominal:     General: Bowel sounds are normal.     Palpations: Abdomen is soft.  Musculoskeletal:        General: Normal range of motion.     Cervical back: Normal range of motion.  Skin:    General: Skin is warm and dry.  Neurological:     General: No focal deficit present.     Mental Status: He is alert and oriented to person, place, and time.  Psychiatric:        Mood and Affect: Mood normal.        Behavior: Behavior normal.        Judgment: Judgment normal.      No results found for any visits on 06/29/23.  Recent Results (from the past 2160 hour(s))  Lipid Panel w/o Chol/HDL Ratio     Status: None   Collection Time: 04/05/23 10:42 AM  Result Value Ref Range   Cholesterol, Total 140 100 - 199 mg/dL   Triglycerides 865 0 - 149 mg/dL   HDL 54 >78 mg/dL   VLDL Cholesterol Cal 25 5 - 40 mg/dL   LDL Chol Calc (NIH) 61 0 - 99 mg/dL  Uric acid     Status: None   Collection Time: 04/05/23 10:42 AM  Result Value Ref Range   Uric Acid 7.2 3.8 - 8.4 mg/dL    Comment:            Therapeutic target for gout patients: <6.0  CMP14+EGFR     Status: Abnormal   Collection Time: 04/05/23 10:42 AM  Result Value Ref Range   Glucose 255 (H) 70 - 99 mg/dL   BUN 20 8 - 27 mg/dL   Creatinine, Ser 4.69 0.76 - 1.27 mg/dL   eGFR 72 >62 XB/MWU/1.32   BUN/Creatinine Ratio 18 10 - 24   Sodium 139 134 - 144 mmol/L   Potassium 4.4 3.5 - 5.2 mmol/L   Chloride 101 96 - 106 mmol/L   CO2 25 20 - 29 mmol/L   Calcium 9.5  8.6 - 10.2 mg/dL  Total Protein 6.7 6.0 - 8.5 g/dL   Albumin 4.5 3.9 - 4.9 g/dL   Globulin, Total 2.2 1.5 - 4.5 g/dL   Bilirubin Total 0.4 0.0 - 1.2 mg/dL   Alkaline Phosphatase 73 44 - 121 IU/L   AST 19 0 - 40 IU/L   ALT 22 0 - 44 IU/L  Hemoglobin A1c     Status: Abnormal   Collection Time: 04/05/23 10:42 AM  Result Value Ref Range   Hgb A1c MFr Bld 5.9 (H) 4.8 - 5.6 %    Comment:          Prediabetes: 5.7 - 6.4          Diabetes: >6.4          Glycemic control for adults with diabetes: <7.0    Est. average glucose Bld gHb Est-mCnc 123 mg/dL  CBC with Differential/Platelet     Status: Abnormal   Collection Time: 04/05/23 10:42 AM  Result Value Ref Range   WBC 6.5 3.4 - 10.8 x10E3/uL   RBC 4.18 4.14 - 5.80 x10E6/uL   Hemoglobin 13.8 13.0 - 17.7 g/dL   Hematocrit 30.8 65.7 - 51.0 %   MCV 101 (H) 79 - 97 fL   MCH 33.0 26.6 - 33.0 pg   MCHC 32.9 31.5 - 35.7 g/dL   RDW 84.6 96.2 - 95.2 %   Platelets 148 (L) 150 - 450 x10E3/uL   Neutrophils 62 Not Estab. %   Lymphs 22 Not Estab. %   Monocytes 9 Not Estab. %   Eos 6 Not Estab. %   Basos 1 Not Estab. %   Neutrophils Absolute 4.1 1.4 - 7.0 x10E3/uL   Lymphocytes Absolute 1.4 0.7 - 3.1 x10E3/uL   Monocytes Absolute 0.6 0.1 - 0.9 x10E3/uL   EOS (ABSOLUTE) 0.4 0.0 - 0.4 x10E3/uL   Basophils Absolute 0.0 0.0 - 0.2 x10E3/uL   Immature Granulocytes 0 Not Estab. %   Immature Grans (Abs) 0.0 0.0 - 0.1 x10E3/uL  POCT XPERT XPRESS SARS COVID-2/FLU/RSV     Status: None   Collection Time: 04/05/23 12:52 PM  Result Value Ref Range   SARS Coronavirus 2 neg    FLU A neg    FLU B neg    RSV RNA, PCR neg       Assessment & Plan:  Patient advised to continue taking all his medications.  Follow instructions as per neurologist. Problem List Items Addressed This Visit     Idiopathic chronic gout of multiple sites without tophus   Mixed hyperlipidemia   Essential hypertension, benign - Primary   Prediabetes   Paresthesias in left hand     Follow up as scheduled.  Total time spent: 25 minutes  Margaretann Loveless, MD  06/29/2023   This document may have been prepared by [facility] Voice Recognition software and as such may include unintentional dictation errors.

## 2023-07-02 DIAGNOSIS — G5612 Other lesions of median nerve, left upper limb: Secondary | ICD-10-CM | POA: Diagnosis not present

## 2023-07-02 DIAGNOSIS — M5412 Radiculopathy, cervical region: Secondary | ICD-10-CM | POA: Diagnosis not present

## 2023-07-20 ENCOUNTER — Other Ambulatory Visit: Payer: Self-pay | Admitting: Cardiovascular Disease

## 2023-07-20 DIAGNOSIS — R0789 Other chest pain: Secondary | ICD-10-CM

## 2023-07-26 DIAGNOSIS — R109 Unspecified abdominal pain: Secondary | ICD-10-CM | POA: Diagnosis not present

## 2023-07-26 DIAGNOSIS — R0989 Other specified symptoms and signs involving the circulatory and respiratory systems: Secondary | ICD-10-CM | POA: Diagnosis not present

## 2023-07-26 DIAGNOSIS — H9203 Otalgia, bilateral: Secondary | ICD-10-CM | POA: Diagnosis not present

## 2023-07-26 DIAGNOSIS — B348 Other viral infections of unspecified site: Secondary | ICD-10-CM | POA: Diagnosis not present

## 2023-07-26 DIAGNOSIS — R42 Dizziness and giddiness: Secondary | ICD-10-CM | POA: Diagnosis not present

## 2023-07-26 DIAGNOSIS — R0789 Other chest pain: Secondary | ICD-10-CM | POA: Diagnosis not present

## 2023-07-26 DIAGNOSIS — R059 Cough, unspecified: Secondary | ICD-10-CM | POA: Diagnosis not present

## 2023-07-26 DIAGNOSIS — Z20822 Contact with and (suspected) exposure to covid-19: Secondary | ICD-10-CM | POA: Diagnosis not present

## 2023-07-26 DIAGNOSIS — R0981 Nasal congestion: Secondary | ICD-10-CM | POA: Diagnosis not present

## 2023-07-30 ENCOUNTER — Encounter: Payer: Self-pay | Admitting: Internal Medicine

## 2023-07-30 ENCOUNTER — Ambulatory Visit (INDEPENDENT_AMBULATORY_CARE_PROVIDER_SITE_OTHER): Payer: Medicare Other | Admitting: Internal Medicine

## 2023-07-30 VITALS — BP 140/82 | HR 89 | Ht 63.0 in | Wt 196.2 lb

## 2023-07-30 DIAGNOSIS — G4733 Obstructive sleep apnea (adult) (pediatric): Secondary | ICD-10-CM | POA: Diagnosis not present

## 2023-07-30 DIAGNOSIS — R7303 Prediabetes: Secondary | ICD-10-CM

## 2023-07-30 DIAGNOSIS — R7301 Impaired fasting glucose: Secondary | ICD-10-CM | POA: Diagnosis not present

## 2023-07-30 DIAGNOSIS — M1A09X0 Idiopathic chronic gout, multiple sites, without tophus (tophi): Secondary | ICD-10-CM

## 2023-07-30 DIAGNOSIS — I10 Essential (primary) hypertension: Secondary | ICD-10-CM

## 2023-07-30 DIAGNOSIS — E782 Mixed hyperlipidemia: Secondary | ICD-10-CM | POA: Diagnosis not present

## 2023-07-30 NOTE — Progress Notes (Signed)
 Established Patient Office Visit  Subjective:  Patient ID: Mike Barnes, male    DOB: Jul 13, 1956  Age: 68 y.o. MRN: [id number]  Chief Complaint  Patient presents with   Follow-up    Follow up    Patient comes in for his follow-up today.  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  He has been referred to [facility] neuropsychologist, but has not made an appointment yet.   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 Patient complains of his chronic back pain.  He did not take his medications yet as he is fasting for blood work.    No other concerns at this time.   Past Medical History:  Diagnosis Date   GERD (gastroesophageal reflux disease)    Hyperlipidemia    Left sided sciatica 09/24/2022   Spinal stenosis of lumbar region with radiculopathy 09/24/2022    Past Surgical History:  Procedure Laterality Date   left shoulder surgery     LUMBAR LAMINECTOMY Left    SPINE SURGERY      Social History   Socioeconomic History   Marital status: Married    Spouse name: Not on file   Number of children: Not on file   Years of education: Not on file   Highest education level: Not on file  Occupational History   Not on file  Tobacco Use   Smoking status: Former    Types: Cigarettes   Smokeless tobacco: Never  Substance and Sexual Activity   Alcohol use: Not Currently   Drug use: Never   Sexual activity: Not on file  Other Topics Concern   Not on file  Social History Narrative   Not on file   Social Drivers of Health   Financial Resource Strain: Not on file  Food Insecurity: Not on file  Transportation Needs: Not on file  Physical Activity: Not on file  Stress: Not on file  Social Connections: Not on file  Intimate Partner Violence: Not on file    Family History  Problem Relation Age of Onset   Hypertension Mother    Gout Father     No Known Allergies  Outpatient Medications  Prior to Visit  Medication Sig   amLODipine  (NORVASC ) 5 MG tablet Take 1 tablet (5 mg total) by mouth daily.   famotidine (PEPCID) 40 MG tablet TAKE 1 TABLET BY MOUTH EVERY DAY   gabapentin (NEURONTIN) 300 MG capsule TAKE 1 CAPSULE BY MOUTH AT BEDTIME FOR 3 DAYS. INCREASE TO 2 TIMES DAILY FOR 3 DAYS, THEN. INCREASE TO 3 TIMES DAILY   pantoprazole  (PROTONIX ) 40 MG tablet TAKE 1 TABLET BY MOUTH EVERY DAY   rosuvastatin  (CRESTOR ) 10 MG tablet TAKE 1 TABLET BY MOUTH EVERY DAY   allopurinol  (ZYLOPRIM ) 100 MG tablet TAKE 1 TABLET BY MOUTH TWICE DAILY (Patient not taking: Reported on 07/30/2023)   colchicine  0.6 MG tablet Take 1 tablet by mouth 2 (two) times daily as needed. (Patient not taking: Reported on 07/30/2023)   meloxicam (MOBIC) 15 MG tablet TAKE 1 TABLET BY MOUTH EVERY DAY (Patient not taking: Reported on 07/30/2023)   No facility-administered medications prior to visit.    Review of Systems  Constitutional: Negative.  Negative for chills, fever, malaise/fatigue and weight loss.  HENT: Negative.  Negative for congestion and sinus pain.   Eyes: Negative.   Respiratory: Negative.  Negative for cough and shortness of breath.   Cardiovascular: Negative.  Negative for chest pain,  palpitations and leg swelling.  Gastrointestinal: Negative.  Negative for abdominal pain, constipation, diarrhea, heartburn, nausea and vomiting.  Genitourinary: Negative.  Negative for dysuria and flank pain.  Musculoskeletal: Negative.  Negative for joint pain and myalgias.  Skin: Negative.   Neurological: Negative.  Negative for dizziness, tingling, tremors, sensory change and headaches.  Endo/Heme/Allergies: Negative.   Psychiatric/Behavioral: Negative.  Negative for depression and suicidal ideas. The patient is not nervous/anxious.        Objective:   BP (!) 140/82   Pulse 89   Ht 5' 3 (1.6 m)   Wt 196 lb 3.2 oz (89 kg)   SpO2 94%   BMI 34.76 kg/m   Vitals:   07/30/23 1018  BP: (!) 140/82  Pulse:  89  Height: 5' 3 (1.6 m)  Weight: 196 lb 3.2 oz (89 kg)  SpO2: 94%  BMI (Calculated): 34.76    Physical Exam Vitals and nursing note reviewed.  Constitutional:      Appearance: Normal appearance.  HENT:     Head: Normocephalic and atraumatic.     Nose: Nose normal.     Mouth/Throat:     Mouth: Mucous membranes are moist.     Pharynx: Oropharynx is clear.  Eyes:     Conjunctiva/sclera: Conjunctivae normal.     Pupils: Pupils are equal, round, and reactive to light.  Cardiovascular:     Rate and Rhythm: Normal rate and regular rhythm.     Pulses: Normal pulses.     Heart sounds: Normal heart sounds.  Pulmonary:     Effort: Pulmonary effort is normal.     Breath sounds: Normal breath sounds.  Abdominal:     General: Bowel sounds are normal.     Palpations: Abdomen is soft.  Musculoskeletal:        General: Normal range of motion.     Cervical back: Normal range of motion.  Skin:    General: Skin is warm and dry.  Neurological:     General: No focal deficit present.     Mental Status: He is alert and oriented to person, place, and time.  Psychiatric:        Mood and Affect: Mood normal.        Behavior: Behavior normal.        Judgment: Judgment normal.      No results found for any visits on 07/30/23.  No results found for this or any previous visit (from the past 2160 hours).    Assessment & Plan:  Continue current medications for now. His PHQ-9 score is high-but unsure if he understood the questionnaire even though it was in Spanish.  One of his family members will accompany him at next visit and will reevaluate. Problem List Items Addressed This Visit     Idiopathic chronic gout of multiple sites without tophus   Relevant Orders   Uric acid   CBC with Diff   Mixed hyperlipidemia   Relevant Orders   Lipid Panel w/o Chol/HDL Ratio   Hemoglobin A1c   Essential hypertension, benign - Primary   Relevant Orders   CMP14+EGFR   Prediabetes   Other Visit  Diagnoses       OSA (obstructive sleep apnea)           Return in about 3 months (around 10/28/2023).   Total time spent: 30 minutes  FERNAND FREDY RAMAN, MD  07/30/2023   This document may have been prepared by [facility] Voice Recognition software and as such may  include unintentional dictation errors.

## 2023-07-31 LAB — CBC WITH DIFFERENTIAL/PLATELET
Basophils Absolute: 0 10*3/uL (ref 0.0–0.2)
Basos: 1 %
EOS (ABSOLUTE): 0.3 10*3/uL (ref 0.0–0.4)
Eos: 4 %
Hematocrit: 44.2 % (ref 37.5–51.0)
Hemoglobin: 15.2 g/dL (ref 13.0–17.7)
Immature Grans (Abs): 0 10*3/uL (ref 0.0–0.1)
Immature Granulocytes: 0 %
Lymphocytes Absolute: 2.1 10*3/uL (ref 0.7–3.1)
Lymphs: 36 %
MCH: 33.2 pg — ABNORMAL HIGH (ref 26.6–33.0)
MCHC: 34.4 g/dL (ref 31.5–35.7)
MCV: 97 fL (ref 79–97)
Monocytes Absolute: 0.6 10*3/uL (ref 0.1–0.9)
Monocytes: 10 %
Neutrophils Absolute: 3 10*3/uL (ref 1.4–7.0)
Neutrophils: 49 %
Platelets: 149 10*3/uL — ABNORMAL LOW (ref 150–450)
RBC: 4.58 x10E6/uL (ref 4.14–5.80)
RDW: 12.8 % (ref 11.6–15.4)
WBC: 6 10*3/uL (ref 3.4–10.8)

## 2023-07-31 LAB — CMP14+EGFR
ALT: 21 [IU]/L (ref 0–44)
AST: 30 [IU]/L (ref 0–40)
Albumin: 4.6 g/dL (ref 3.9–4.9)
Alkaline Phosphatase: 72 [IU]/L (ref 44–121)
BUN/Creatinine Ratio: 19 (ref 10–24)
BUN: 21 mg/dL (ref 8–27)
Bilirubin Total: 0.5 mg/dL (ref 0.0–1.2)
CO2: 23 mmol/L (ref 20–29)
Calcium: 9.6 mg/dL (ref 8.6–10.2)
Chloride: 100 mmol/L (ref 96–106)
Creatinine, Ser: 1.08 mg/dL (ref 0.76–1.27)
Globulin, Total: 2.4 g/dL (ref 1.5–4.5)
Glucose: 113 mg/dL — ABNORMAL HIGH (ref 70–99)
Potassium: 4.5 mmol/L (ref 3.5–5.2)
Sodium: 142 mmol/L (ref 134–144)
Total Protein: 7 g/dL (ref 6.0–8.5)
eGFR: 75 mL/min/{1.73_m2} (ref >59)

## 2023-07-31 LAB — LIPID PANEL W/O CHOL/HDL RATIO
Cholesterol, Total: 137 mg/dL (ref 100–199)
HDL: 52 mg/dL (ref >39)
LDL Chol Calc (NIH): 63 mg/dL (ref 0–99)
Triglycerides: 124 mg/dL (ref 0–149)
VLDL Cholesterol Cal: 22 mg/dL (ref 5–40)

## 2023-07-31 LAB — URIC ACID: Uric Acid: 7.9 mg/dL (ref 3.8–8.4)

## 2023-07-31 LAB — HEMOGLOBIN A1C
Est. average glucose Bld gHb Est-mCnc: 128 mg/dL
Hgb A1c MFr Bld: 6.1 % — ABNORMAL HIGH (ref 4.8–5.6)

## 2023-08-02 ENCOUNTER — Other Ambulatory Visit: Payer: Self-pay | Admitting: Internal Medicine

## 2023-08-02 DIAGNOSIS — M1A09X0 Idiopathic chronic gout, multiple sites, without tophus (tophi): Secondary | ICD-10-CM

## 2023-08-02 MED ORDER — ALLOPURINOL 300 MG PO TABS
300.0000 mg | ORAL_TABLET | Freq: Every day | ORAL | 2 refills | Status: DC
Start: 2023-08-02 — End: 2023-10-22

## 2023-08-27 DIAGNOSIS — H16223 Keratoconjunctivitis sicca, not specified as Sjogren's, bilateral: Secondary | ICD-10-CM | POA: Diagnosis not present

## 2023-10-17 ENCOUNTER — Other Ambulatory Visit: Payer: Self-pay | Admitting: Cardiovascular Disease

## 2023-10-17 DIAGNOSIS — R0789 Other chest pain: Secondary | ICD-10-CM

## 2023-10-18 ENCOUNTER — Other Ambulatory Visit: Payer: Self-pay | Admitting: Family

## 2023-10-22 ENCOUNTER — Other Ambulatory Visit: Payer: Self-pay

## 2023-10-22 DIAGNOSIS — M1A09X0 Idiopathic chronic gout, multiple sites, without tophus (tophi): Secondary | ICD-10-CM

## 2023-10-22 MED ORDER — ALLOPURINOL 300 MG PO TABS
300.0000 mg | ORAL_TABLET | Freq: Every day | ORAL | 2 refills | Status: DC
Start: 2023-10-22 — End: 2023-11-04

## 2023-10-28 ENCOUNTER — Ambulatory Visit (INDEPENDENT_AMBULATORY_CARE_PROVIDER_SITE_OTHER): Payer: Medicare (Managed Care) | Admitting: Internal Medicine

## 2023-10-28 ENCOUNTER — Encounter: Payer: Self-pay | Admitting: Internal Medicine

## 2023-10-28 VITALS — BP 134/86 | HR 79 | Ht 63.0 in | Wt 198.4 lb

## 2023-10-28 DIAGNOSIS — I10 Essential (primary) hypertension: Secondary | ICD-10-CM | POA: Diagnosis not present

## 2023-10-28 DIAGNOSIS — E782 Mixed hyperlipidemia: Secondary | ICD-10-CM

## 2023-10-28 DIAGNOSIS — Z1211 Encounter for screening for malignant neoplasm of colon: Secondary | ICD-10-CM

## 2023-10-28 DIAGNOSIS — R7303 Prediabetes: Secondary | ICD-10-CM | POA: Diagnosis not present

## 2023-10-28 DIAGNOSIS — M1A09X0 Idiopathic chronic gout, multiple sites, without tophus (tophi): Secondary | ICD-10-CM

## 2023-10-28 DIAGNOSIS — K219 Gastro-esophageal reflux disease without esophagitis: Secondary | ICD-10-CM

## 2023-10-28 MED ORDER — AMLODIPINE BESYLATE 5 MG PO TABS
5.0000 mg | ORAL_TABLET | Freq: Every day | ORAL | 3 refills | Status: AC
Start: 2023-10-28 — End: 2024-10-27

## 2023-10-28 MED ORDER — COLCHICINE 0.6 MG PO TABS
0.6000 mg | ORAL_TABLET | Freq: Two times a day (BID) | ORAL | 3 refills | Status: AC | PRN
Start: 2023-10-28 — End: ?

## 2023-10-28 MED ORDER — PANTOPRAZOLE SODIUM 40 MG PO TBEC
40.0000 mg | DELAYED_RELEASE_TABLET | Freq: Every day | ORAL | 3 refills | Status: AC
Start: 2023-10-28 — End: ?

## 2023-10-28 NOTE — Progress Notes (Signed)
 Established Patient Office Visit  Subjective:  Patient ID: Mike Barnes, male    DOB: 1955-11-12  Age: 68 y.o. MRN: [id number]  Chief Complaint  Patient presents with   Follow-up    3 month follow up    Patient comes in for follow up.  He is generally feeling well and has no new complaints.  His blood pressure is elevated as he has not taken his medications yet.  He is fasting for blood work.  There is a confusion about his blood pressure medication.  Back in September he was having some cough and tickle in throat so his Zestril was switched over to amlodipine 5 mg/day.  Patient is not aware of that and had to continue taking Zestril.  Today he needs refills on his medications.  In the presence of an interpreter he was explained again that he has to pick up amlodipine 5 mg from the pharmacy and start taking it.  He will get his labs today.  Return in 1 week for blood pressure check also advised to bring all his medications.    No other concerns at this time.   Past Medical History:  Diagnosis Date   GERD (gastroesophageal reflux disease)    Hyperlipidemia    Left sided sciatica 09/24/2022   Spinal stenosis of lumbar region with radiculopathy 09/24/2022    Past Surgical History:  Procedure Laterality Date   left shoulder surgery     LUMBAR LAMINECTOMY Left    SPINE SURGERY      Social History   Socioeconomic History   Marital status: Married    Spouse name: Not on file   Number of children: Not on file   Years of education: Not on file   Highest education level: Not on file  Occupational History   Not on file  Tobacco Use   Smoking status: Former    Types: Cigarettes   Smokeless tobacco: Never  Substance and Sexual Activity   Alcohol use: Not Currently   Drug use: Never   Sexual activity: Not on file  Other Topics Concern   Not on file  Social History Narrative   Not on file   Social Drivers of Health   Financial Resource Strain: Not on file  Food  Insecurity: Not on file  Transportation Needs: Not on file  Physical Activity: Not on file  Stress: Not on file  Social Connections: Not on file  Intimate Partner Violence: Not on file    Family History  Problem Relation Age of Onset   Hypertension Mother    Gout Father     No Known Allergies  Outpatient Medications Prior to Visit  Medication Sig   allopurinol (ZYLOPRIM) 300 MG tablet Take 1 tablet (300 mg total) by mouth daily.   famotidine (PEPCID) 40 MG tablet TAKE 1 TABLET BY MOUTH EVERY DAY   gabapentin (NEURONTIN) 300 MG capsule TAKE 1 CAPSULE BY MOUTH AT BEDTIME FOR 3 DAYS. INCREASE TO 2 TIMES DAILY FOR 3 DAYS, THEN. INCREASE TO 3 TIMES DAILY   meloxicam (MOBIC) 15 MG tablet TAKE 1 TABLET BY MOUTH EVERY DAY   rosuvastatin (CRESTOR) 10 MG tablet TAKE 1 TABLET BY MOUTH EVERY DAY   [DISCONTINUED] amLODipine (NORVASC) 5 MG tablet Take 1 tablet (5 mg total) by mouth daily.   [DISCONTINUED] colchicine 0.6 MG tablet Take 1 tablet by mouth 2 (two) times daily as needed.   [DISCONTINUED] pantoprazole (PROTONIX) 40 MG tablet TAKE 1 TABLET BY MOUTH EVERY DAY  No facility-administered medications prior to visit.    Review of Systems  Constitutional: Negative.  Negative for chills, fever, malaise/fatigue and weight loss.  HENT: Negative.  Negative for nosebleeds and sore throat.   Eyes: Negative.   Respiratory: Negative.  Negative for cough and shortness of breath.   Cardiovascular: Negative.  Negative for chest pain, palpitations and leg swelling.  Gastrointestinal: Negative.  Negative for abdominal pain, constipation, diarrhea, heartburn, nausea and vomiting.  Genitourinary: Negative.  Negative for dysuria and flank pain.  Musculoskeletal: Negative.  Negative for joint pain and myalgias.  Skin: Negative.   Neurological: Negative.  Negative for dizziness and headaches.  Endo/Heme/Allergies: Negative.   Psychiatric/Behavioral: Negative.  Negative for depression and suicidal  ideas. The patient is not nervous/anxious.        Objective:   BP 134/86   Pulse 79   Ht 5\' 3"  (1.6 m)   Wt 198 lb 6.4 oz (90 kg)   SpO2 94%   BMI 35.14 kg/m   Vitals:   10/28/23 1014  BP: 134/86  Pulse: 79  Height: 5\' 3"  (1.6 m)  Weight: 198 lb 6.4 oz (90 kg)  SpO2: 94%  BMI (Calculated): 35.15    Physical Exam Vitals and nursing note reviewed.  Constitutional:      Appearance: Normal appearance.  HENT:     Head: Normocephalic and atraumatic.     Nose: Nose normal.     Mouth/Throat:     Mouth: Mucous membranes are moist.     Pharynx: Oropharynx is clear.  Eyes:     Conjunctiva/sclera: Conjunctivae normal.     Pupils: Pupils are equal, round, and reactive to light.  Cardiovascular:     Rate and Rhythm: Normal rate and regular rhythm.     Pulses: Normal pulses.     Heart sounds: Normal heart sounds.  Pulmonary:     Effort: Pulmonary effort is normal.     Breath sounds: Normal breath sounds.  Abdominal:     General: Bowel sounds are normal.     Palpations: Abdomen is soft.  Musculoskeletal:        General: Normal range of motion.     Cervical back: Normal range of motion.  Skin:    General: Skin is warm and dry.  Neurological:     General: No focal deficit present.     Mental Status: He is alert and oriented to person, place, and time.  Psychiatric:        Mood and Affect: Mood normal.        Behavior: Behavior normal.        Judgment: Judgment normal.      No results found for any visits on 10/28/23.  No results found for this or any previous visit (from the past 2160 hours).    Assessment & Plan:  Continue all medications.  Check labs today. Problem List Items Addressed This Visit     Idiopathic chronic gout of multiple sites without tophus   Relevant Medications   colchicine 0.6 MG tablet   Other Relevant Orders   Uric acid   Mixed hyperlipidemia   Relevant Medications   amLODipine (NORVASC) 5 MG tablet   Gastroesophageal reflux  disease without esophagitis   Relevant Medications   pantoprazole (PROTONIX) 40 MG tablet   Other Relevant Orders   CBC with Diff   Essential hypertension, benign - Primary   Relevant Medications   amLODipine (NORVASC) 5 MG tablet   Other Relevant Orders   CMP14+EGFR  Prediabetes   Relevant Orders   Lipid Panel w/o Chol/HDL Ratio   Hemoglobin A1c   Other Visit Diagnoses       Colon cancer screening       Relevant Orders   Ambulatory referral to Gastroenterology       Return in about 1 week (around 11/04/2023).   Total time spent: 30 minutes  Margaretann Loveless, MD  10/28/2023   This document may have been prepared by [facility] Voice Recognition software and as such may include unintentional dictation errors.

## 2023-10-29 LAB — CBC WITH DIFFERENTIAL/PLATELET
Basophils Absolute: 0 10*3/uL (ref 0.0–0.2)
Basos: 1 %
EOS (ABSOLUTE): 0.3 10*3/uL (ref 0.0–0.4)
Eos: 5 %
Hematocrit: 42.4 % (ref 37.5–51.0)
Hemoglobin: 14.1 g/dL (ref 13.0–17.7)
Immature Grans (Abs): 0 10*3/uL (ref 0.0–0.1)
Immature Granulocytes: 0 %
Lymphocytes Absolute: 2.4 10*3/uL (ref 0.7–3.1)
Lymphs: 44 %
MCH: 33.3 pg — ABNORMAL HIGH (ref 26.6–33.0)
MCHC: 33.3 g/dL (ref 31.5–35.7)
MCV: 100 fL — ABNORMAL HIGH (ref 79–97)
Monocytes Absolute: 0.5 10*3/uL (ref 0.1–0.9)
Monocytes: 9 %
Neutrophils Absolute: 2.2 10*3/uL (ref 1.4–7.0)
Neutrophils: 41 %
Platelets: 154 10*3/uL (ref 150–450)
RBC: 4.23 x10E6/uL (ref 4.14–5.80)
RDW: 13.6 % (ref 11.6–15.4)
WBC: 5.5 10*3/uL (ref 3.4–10.8)

## 2023-10-29 LAB — CMP14+EGFR
ALT: 25 IU/L (ref 0–44)
AST: 26 IU/L (ref 0–40)
Albumin: 4.4 g/dL (ref 3.9–4.9)
Alkaline Phosphatase: 79 IU/L (ref 44–121)
BUN/Creatinine Ratio: 15 (ref 10–24)
BUN: 16 mg/dL (ref 8–27)
Bilirubin Total: 0.6 mg/dL (ref 0.0–1.2)
CO2: 25 mmol/L (ref 20–29)
Calcium: 9.5 mg/dL (ref 8.6–10.2)
Chloride: 103 mmol/L (ref 96–106)
Creatinine, Ser: 1.09 mg/dL (ref 0.76–1.27)
Globulin, Total: 2.2 g/dL (ref 1.5–4.5)
Glucose: 103 mg/dL — ABNORMAL HIGH (ref 70–99)
Potassium: 4.5 mmol/L (ref 3.5–5.2)
Sodium: 141 mmol/L (ref 134–144)
Total Protein: 6.6 g/dL (ref 6.0–8.5)
eGFR: 74 mL/min/{1.73_m2} (ref >59)

## 2023-10-29 LAB — HEMOGLOBIN A1C
Est. average glucose Bld gHb Est-mCnc: 126 mg/dL
Hgb A1c MFr Bld: 6 % — ABNORMAL HIGH (ref 4.8–5.6)

## 2023-10-29 LAB — URIC ACID: Uric Acid: 6.7 mg/dL (ref 3.8–8.4)

## 2023-10-29 LAB — LIPID PANEL W/O CHOL/HDL RATIO
Cholesterol, Total: 102 mg/dL (ref 100–199)
HDL: 43 mg/dL (ref >39)
LDL Chol Calc (NIH): 45 mg/dL (ref 0–99)
Triglycerides: 65 mg/dL (ref 0–149)
VLDL Cholesterol Cal: 14 mg/dL (ref 5–40)

## 2023-10-30 DIAGNOSIS — G4733 Obstructive sleep apnea (adult) (pediatric): Secondary | ICD-10-CM | POA: Diagnosis not present

## 2023-10-30 DIAGNOSIS — R0683 Snoring: Secondary | ICD-10-CM | POA: Diagnosis not present

## 2023-11-04 ENCOUNTER — Ambulatory Visit (INDEPENDENT_AMBULATORY_CARE_PROVIDER_SITE_OTHER): Payer: Medicare (Managed Care) | Admitting: Internal Medicine

## 2023-11-04 ENCOUNTER — Encounter: Payer: Self-pay | Admitting: Internal Medicine

## 2023-11-04 VITALS — BP 110/75 | HR 83 | Ht 63.0 in | Wt 198.2 lb

## 2023-11-04 DIAGNOSIS — M1A09X0 Idiopathic chronic gout, multiple sites, without tophus (tophi): Secondary | ICD-10-CM

## 2023-11-04 DIAGNOSIS — R351 Nocturia: Secondary | ICD-10-CM

## 2023-11-04 DIAGNOSIS — R7303 Prediabetes: Secondary | ICD-10-CM

## 2023-11-04 DIAGNOSIS — I10 Essential (primary) hypertension: Secondary | ICD-10-CM

## 2023-11-04 DIAGNOSIS — K219 Gastro-esophageal reflux disease without esophagitis: Secondary | ICD-10-CM | POA: Diagnosis not present

## 2023-11-04 DIAGNOSIS — Z860109 Personal history of other colon polyps: Secondary | ICD-10-CM | POA: Diagnosis not present

## 2023-11-04 DIAGNOSIS — N401 Enlarged prostate with lower urinary tract symptoms: Secondary | ICD-10-CM | POA: Diagnosis not present

## 2023-11-04 DIAGNOSIS — E782 Mixed hyperlipidemia: Secondary | ICD-10-CM | POA: Diagnosis not present

## 2023-11-04 MED ORDER — ALLOPURINOL 300 MG PO TABS: 300.0000 mg | ORAL_TABLET | Freq: Every day | ORAL | 2 refills | Status: AC

## 2023-11-04 NOTE — Progress Notes (Signed)
 Established Patient Office Visit  Subjective:  Patient ID: Mike Barnes, male    DOB: Jun 11, 1956  Age: 68 y.o. MRN: [id number]  Chief Complaint  Patient presents with   Follow-up    1 week    Patient comes in for his follow-up today.  And he brought in his medications.  Now he is taking amlodipine for his blood pressure control.  His labs were done recently and the results discussed today.  His lipid panel looks normal the patient reports he had not taken his statin for a while.  Advised to stay off the statin will continue to monitor monitor his lipid control.  His uric acid is also within normal limits, but he ran out of allopurinol, refill sent.   Patient has history of colon polyps, needs a referral for a repeat colonoscopy. With the help of the interpreter he mentions that he is having some nocturia, and increased frequency of micturition, requests a urology consultation.  Referral sent,check PSA.    No other concerns at this time.   Past Medical History:  Diagnosis Date   GERD (gastroesophageal reflux disease)    Hyperlipidemia    Left sided sciatica 09/24/2022   Spinal stenosis of lumbar region with radiculopathy 09/24/2022    Past Surgical History:  Procedure Laterality Date   left shoulder surgery     LUMBAR LAMINECTOMY Left    SPINE SURGERY      Social History   Socioeconomic History   Marital status: Married    Spouse name: Not on file   Number of children: Not on file   Years of education: Not on file   Highest education level: Not on file  Occupational History   Not on file  Tobacco Use   Smoking status: Former    Types: Cigarettes   Smokeless tobacco: Never  Substance and Sexual Activity   Alcohol use: Not Currently   Drug use: Never   Sexual activity: Not on file  Other Topics Concern   Not on file  Social History Narrative   Not on file   Social Drivers of Health   Financial Resource Strain: Not on file  Food Insecurity: Not on file   Transportation Needs: Not on file  Physical Activity: Not on file  Stress: Not on file  Social Connections: Not on file  Intimate Partner Violence: Not on file    Family History  Problem Relation Age of Onset   Hypertension Mother    Gout Father     No Known Allergies  Outpatient Medications Prior to Visit  Medication Sig Note   amLODipine (NORVASC) 5 MG tablet Take 1 tablet (5 mg total) by mouth daily.    colchicine 0.6 MG tablet Take 1 tablet (0.6 mg total) by mouth 2 (two) times daily as needed.    donepezil (ARICEPT) 5 MG tablet Take 5 mg by mouth at bedtime.    pantoprazole (PROTONIX) 40 MG tablet Take 1 tablet (40 mg total) by mouth daily.    meloxicam (MOBIC) 15 MG tablet TAKE 1 TABLET BY MOUTH EVERY DAY (Patient not taking: Reported on 11/04/2023)    rosuvastatin (CRESTOR) 10 MG tablet TAKE 1 TABLET BY MOUTH EVERY DAY (Patient not taking: Reported on 11/04/2023)    [DISCONTINUED] allopurinol (ZYLOPRIM) 300 MG tablet Take 1 tablet (300 mg total) by mouth daily. (Patient not taking: Reported on 11/04/2023)    [DISCONTINUED] famotidine (PEPCID) 40 MG tablet TAKE 1 TABLET BY MOUTH EVERY DAY (Patient not taking: Reported  on 11/04/2023)    [DISCONTINUED] gabapentin (NEURONTIN) 300 MG capsule TAKE 1 CAPSULE BY MOUTH AT BEDTIME FOR 3 DAYS. INCREASE TO 2 TIMES DAILY FOR 3 DAYS, THEN. INCREASE TO 3 TIMES DAILY (Patient not taking: Reported on 11/04/2023) 11/04/2023: Causing headaches   No facility-administered medications prior to visit.    Review of Systems  Constitutional: Negative.  Negative for chills, fever and weight loss.  HENT: Negative.  Negative for sore throat.   Eyes: Negative.   Respiratory: Negative.  Negative for cough and shortness of breath.   Cardiovascular: Negative.  Negative for chest pain, palpitations and leg swelling.  Gastrointestinal: Negative.  Negative for abdominal pain, constipation, diarrhea, heartburn, nausea and vomiting.  Genitourinary: Negative.   Negative for dysuria and flank pain.  Musculoskeletal: Negative.  Negative for joint pain and myalgias.  Skin: Negative.   Neurological: Negative.  Negative for dizziness, tingling, tremors and headaches.  Endo/Heme/Allergies: Negative.   Psychiatric/Behavioral: Negative.  Negative for depression and suicidal ideas. The patient is not nervous/anxious.        Objective:   BP 110/75   Pulse 83   Ht 5\' 3"  (1.6 m)   Wt 198 lb 3.2 oz (89.9 kg)   SpO2 95%   BMI 35.11 kg/m   Vitals:   11/04/23 1303  BP: 110/75  Pulse: 83  Height: 5\' 3"  (1.6 m)  Weight: 198 lb 3.2 oz (89.9 kg)  SpO2: 95%  BMI (Calculated): 35.12    Physical Exam Vitals and nursing note reviewed.  Constitutional:      Appearance: Normal appearance.  HENT:     Head: Normocephalic and atraumatic.     Nose: Nose normal.     Mouth/Throat:     Mouth: Mucous membranes are moist.     Pharynx: Oropharynx is clear.  Eyes:     Conjunctiva/sclera: Conjunctivae normal.     Pupils: Pupils are equal, round, and reactive to light.  Cardiovascular:     Rate and Rhythm: Normal rate and regular rhythm.     Pulses: Normal pulses.     Heart sounds: Normal heart sounds.  Pulmonary:     Effort: Pulmonary effort is normal.     Breath sounds: Normal breath sounds.  Abdominal:     General: Bowel sounds are normal.     Palpations: Abdomen is soft.  Musculoskeletal:        General: Normal range of motion.     Cervical back: Normal range of motion.  Skin:    General: Skin is warm and dry.  Neurological:     General: No focal deficit present.     Mental Status: He is alert and oriented to person, place, and time.  Psychiatric:        Mood and Affect: Mood normal.        Behavior: Behavior normal.        Judgment: Judgment normal.      No results found for any visits on 11/04/23.  Recent Results (from the past 2160 hours)  Uric acid     Status: None   Collection Time: 10/28/23 10:37 AM  Result Value Ref Range    Uric Acid 6.7 3.8 - 8.4 mg/dL    Comment:            Therapeutic target for gout patients: <6.0  CBC with Diff     Status: Abnormal   Collection Time: 10/28/23 10:37 AM  Result Value Ref Range   WBC 5.5 3.4 - 10.8 x10E3/uL  RBC 4.23 4.14 - 5.80 x10E6/uL   Hemoglobin 14.1 13.0 - 17.7 g/dL   Hematocrit 86.5 78.4 - 51.0 %   MCV 100 (H) 79 - 97 fL   MCH 33.3 (H) 26.6 - 33.0 pg   MCHC 33.3 31.5 - 35.7 g/dL   RDW 69.6 29.5 - 28.4 %   Platelets 154 150 - 450 x10E3/uL   Neutrophils 41 Not Estab. %   Lymphs 44 Not Estab. %   Monocytes 9 Not Estab. %   Eos 5 Not Estab. %   Basos 1 Not Estab. %   Neutrophils Absolute 2.2 1.4 - 7.0 x10E3/uL   Lymphocytes Absolute 2.4 0.7 - 3.1 x10E3/uL   Monocytes Absolute 0.5 0.1 - 0.9 x10E3/uL   EOS (ABSOLUTE) 0.3 0.0 - 0.4 x10E3/uL   Basophils Absolute 0.0 0.0 - 0.2 x10E3/uL   Immature Granulocytes 0 Not Estab. %   Immature Grans (Abs) 0.0 0.0 - 0.1 x10E3/uL  CMP14+EGFR     Status: Abnormal   Collection Time: 10/28/23 10:37 AM  Result Value Ref Range   Glucose 103 (H) 70 - 99 mg/dL   BUN 16 8 - 27 mg/dL   Creatinine, Ser 1.32 0.76 - 1.27 mg/dL   eGFR 74 >44 WN/UUV/2.53   BUN/Creatinine Ratio 15 10 - 24   Sodium 141 134 - 144 mmol/L   Potassium 4.5 3.5 - 5.2 mmol/L   Chloride 103 96 - 106 mmol/L   CO2 25 20 - 29 mmol/L   Calcium 9.5 8.6 - 10.2 mg/dL   Total Protein 6.6 6.0 - 8.5 g/dL   Albumin 4.4 3.9 - 4.9 g/dL   Globulin, Total 2.2 1.5 - 4.5 g/dL   Bilirubin Total 0.6 0.0 - 1.2 mg/dL   Alkaline Phosphatase 79 44 - 121 IU/L   AST 26 0 - 40 IU/L   ALT 25 0 - 44 IU/L  Lipid Panel w/o Chol/HDL Ratio     Status: None   Collection Time: 10/28/23 10:37 AM  Result Value Ref Range   Cholesterol, Total 102 100 - 199 mg/dL   Triglycerides 65 0 - 149 mg/dL   HDL 43 >66 mg/dL   VLDL Cholesterol Cal 14 5 - 40 mg/dL   LDL Chol Calc (NIH) 45 0 - 99 mg/dL  Hemoglobin Y4I     Status: Abnormal   Collection Time: 10/28/23 10:37 AM  Result Value Ref Range    Hgb A1c MFr Bld 6.0 (H) 4.8 - 5.6 %    Comment:          Prediabetes: 5.7 - 6.4          Diabetes: >6.4          Glycemic control for adults with diabetes: <7.0    Est. average glucose Bld gHb Est-mCnc 126 mg/dL      Assessment & Plan:  Continue current medications.  Set up urology and GI referrals. Problem List Items Addressed This Visit     Idiopathic chronic gout of multiple sites without tophus   Relevant Medications   allopurinol (ZYLOPRIM) 300 MG tablet   Mixed hyperlipidemia   Gastroesophageal reflux disease without esophagitis   Essential hypertension, benign - Primary   Prediabetes   Other Visit Diagnoses       Personal history of other colon polyps       Relevant Orders   Ambulatory referral to Gastroenterology     BPH associated with nocturia       Relevant Orders   Ambulatory referral to  Urology   PSA       Return in about 3 months (around 02/03/2024).   Total time spent: 30 minutes  Margaretann Loveless, MD  11/04/2023   This document may have been prepared by [facility] Voice Recognition software and as such may include unintentional dictation errors.

## 2023-11-05 LAB — PSA: Prostate Specific Ag, Serum: 0.5 ng/mL (ref 0.0–4.0)

## 2023-11-09 DIAGNOSIS — K439 Ventral hernia without obstruction or gangrene: Secondary | ICD-10-CM | POA: Diagnosis not present

## 2023-11-09 DIAGNOSIS — R109 Unspecified abdominal pain: Secondary | ICD-10-CM | POA: Diagnosis not present

## 2023-11-09 DIAGNOSIS — R1013 Epigastric pain: Secondary | ICD-10-CM | POA: Diagnosis not present

## 2023-11-12 DIAGNOSIS — R1013 Epigastric pain: Secondary | ICD-10-CM | POA: Diagnosis not present

## 2023-11-16 ENCOUNTER — Other Ambulatory Visit: Payer: Self-pay

## 2023-11-16 DIAGNOSIS — N401 Enlarged prostate with lower urinary tract symptoms: Secondary | ICD-10-CM

## 2023-11-17 ENCOUNTER — Ambulatory Visit (INDEPENDENT_AMBULATORY_CARE_PROVIDER_SITE_OTHER): Payer: Medicare (Managed Care) | Admitting: Urology

## 2023-11-17 ENCOUNTER — Other Ambulatory Visit
Admission: RE | Admit: 2023-11-17 | Discharge: 2023-11-17 | Disposition: A | Discharge status: Home / Self Care | Payer: Medicare (Managed Care) | Attending: Urology | Admitting: Urology

## 2023-11-17 VITALS — BP 133/83 | HR 87 | Ht 63.0 in | Wt 197.2 lb

## 2023-11-17 DIAGNOSIS — N401 Enlarged prostate with lower urinary tract symptoms: Secondary | ICD-10-CM

## 2023-11-17 DIAGNOSIS — Z125 Encounter for screening for malignant neoplasm of prostate: Secondary | ICD-10-CM

## 2023-11-17 DIAGNOSIS — R351 Nocturia: Secondary | ICD-10-CM

## 2023-11-17 LAB — URINALYSIS, COMPLETE (UACMP) WITH MICROSCOPIC
Bilirubin Urine: NEGATIVE
Glucose, UA: NEGATIVE mg/dL
Hgb urine dipstick: NEGATIVE
Leukocytes,Ua: NEGATIVE
Nitrite: NEGATIVE
Protein, ur: NEGATIVE mg/dL
RBC / HPF: NONE SEEN RBC/hpf (ref 0–5)
Specific Gravity, Urine: 1.02 (ref 1.005–1.030)
pH: 6 (ref 5.0–8.0)

## 2023-11-17 LAB — BLADDER SCAN AMB NON-IMAGING

## 2023-11-17 NOTE — Progress Notes (Signed)
   11/17/23 3:15 PM   Mike Barnes [birth year] [id number]  CC: Urinary symptoms/nocturia, PSA screening  HPI: 68 year old male who reports worsening urinary symptoms with primarily nocturia 3-4 times overnight.  Notably he was recently diagnosed with sleep apnea and has not yet started CPAP as approval is still going through to obtain the machine.  He has some intermittent dysuria of unclear etiology, but denies any gross hematuria.  Urinalysis today is completely benign, recent PSA is normal at 0.5.  Normal PVR of 34ml.    PMH: Past Medical History:  Diagnosis Date   GERD (gastroesophageal reflux disease)    Hyperlipidemia    Left sided sciatica 09/24/2022   Spinal stenosis of lumbar region with radiculopathy 09/24/2022    Surgical History: Past Surgical History:  Procedure Laterality Date   left shoulder surgery     LUMBAR LAMINECTOMY Left    SPINE SURGERY      Family History: Family History  Problem Relation Age of Onset   Hypertension Mother    Gout Father     Social History:  reports that he has quit smoking. His smoking use included cigarettes. He has never used smokeless tobacco. He reports that he does not currently use alcohol. He reports that he does not use drugs.  Physical Exam: BP 133/83 (BP Location: Left Arm, Patient Position: Sitting, Cuff Size: Large)   Pulse 87   Ht 5\' 3"  (1.6 m)   Wt 197 lb 3.2 oz (89.4 kg)   SpO2 96%   BMI 34.93 kg/m    Constitutional:  Alert and oriented, No acute distress. Cardiovascular: No clubbing, cyanosis, or edema. Respiratory: Normal respiratory effort, no increased work of breathing. GI: Abdomen is soft, nontender, nondistended, no abdominal masses   Laboratory Data: Reviewed, see HPI   Assessment & Plan:   68 year old with primary urinary symptoms of nocturia 3-4 times at night, recently diagnosed with sleep apnea but has not yet started CPAP machine.  We discussed this is a very common cause of nocturia  and would recommend starting CPAP prior to initiating additional medications.  We discussed avoiding bladder irritants and minimizing fluids prior to bedtime.  Reassurance provided regarding normal urinalysis, normal PSA, normal PVR.  Could consider trial of Flomax in the future if persistent symptoms after initiation of CPAP.  -Recommend starting CPAP prior to considering additional medications, has nocturia most likely secondary to untreated sleep apnea -PCP can consider Flomax in the future if persistent or worsening symptoms -Follow-up with urology as needed  Jay Meth, MD 11/17/2023  [facility] [address] [place], [place] [address] [phone]

## 2023-11-17 NOTE — Patient Instructions (Signed)
 Sleep apnea is a common cause of nocturia-> when you start your CPAP machine the overnight urination should improve significantly   Nocturia refers to the need to wake up during the night to urinate, which can disrupt your sleep and impact your overall well-being. Fortunately, there are several strategies you can employ to help prevent or manage nocturia. It's important to consult with your healthcare provider before making any significant changes to your routine. Here are some helpful strategies to consider:  Limit Fluid Intake Before Bed: Avoid drinking large amounts of fluids in the evening, especially within a few hours of bedtime. Consume most of your daily fluid intake earlier in the day to reduce the need to urinate at night.  Monitor Your Diet: Limit your intake of caffeine and alcohol, as these substances can increase urine production and irritate the bladder.  Avoid diet, "zero calorie," and artificially sweetened drinks, especially sodas, in the afternoon or evening. Be mindful of consuming foods and drinks with high water content before bedtime, such as watermelon and herbal teas.  Time Your Medications: If you're taking medications that contribute to increased urination, consult your healthcare provider about adjusting the timing of these medications to minimize their impact during the night.  Practice Double Voiding: Before going to bed, make an effort to empty your bladder twice within a short period. This can help reduce the amount of urine left in your bladder before sleep.  Bladder Training: Gradually increase the time between bathroom visits during the day to train your bladder to hold larger volumes of urine. Over time, this can help reduce the frequency of nighttime awakenings to urinate.  Elevate Your Legs During the Day: Elevating your legs during the day can help minimize fluid retention in your lower extremities, which might reduce nighttime urination.  Pelvic Floor  Exercises: Strengthening your pelvic floor muscles through Kegel exercises can help improve bladder control and potentially reduce the urge to urinate at night.  Create a Relaxing Bedtime Routine: Stress and anxiety can exacerbate nocturia. Engage in calming activities before bed, such as reading, listening to soothing music, or practicing relaxation techniques.  Stay Active: Engage in regular physical activity, but avoid intense exercise close to bedtime, as this can increase your body's demand for fluids.  Maintain a Healthy Weight: Excess weight can compress the bladder and contribute to bladder and urinary issues. Aim to achieve and maintain a healthy weight through a balanced diet and regular exercise.  Remember that every individual is unique, and the effectiveness of these strategies may vary. It's important to work with your healthcare provider to develop a plan that suits your specific needs and addresses any underlying causes of nocturia.

## 2023-11-18 DIAGNOSIS — M50123 Cervical disc disorder at C6-C7 level with radiculopathy: Secondary | ICD-10-CM | POA: Diagnosis not present

## 2023-11-18 DIAGNOSIS — M5412 Radiculopathy, cervical region: Secondary | ICD-10-CM | POA: Diagnosis not present

## 2024-01-21 DIAGNOSIS — G4734 Idiopathic sleep related nonobstructive alveolar hypoventilation: Secondary | ICD-10-CM | POA: Diagnosis not present

## 2024-01-21 DIAGNOSIS — Z87891 Personal history of nicotine dependence: Secondary | ICD-10-CM | POA: Diagnosis not present

## 2024-01-21 DIAGNOSIS — G4733 Obstructive sleep apnea (adult) (pediatric): Secondary | ICD-10-CM | POA: Diagnosis not present

## 2024-01-24 DIAGNOSIS — G4733 Obstructive sleep apnea (adult) (pediatric): Secondary | ICD-10-CM | POA: Diagnosis not present

## 2024-02-01 ENCOUNTER — Ambulatory Visit: Payer: Medicare (Managed Care) | Admitting: Cardiology

## 2024-02-03 ENCOUNTER — Ambulatory Visit: Payer: Medicare (Managed Care) | Admitting: Internal Medicine

## 2024-02-11 ENCOUNTER — Encounter: Payer: Self-pay | Admitting: Internal Medicine

## 2024-02-11 ENCOUNTER — Ambulatory Visit (INDEPENDENT_AMBULATORY_CARE_PROVIDER_SITE_OTHER): Admitting: Internal Medicine

## 2024-02-11 VITALS — BP 124/74 | HR 97 | Ht 63.0 in | Wt 208.0 lb

## 2024-02-11 DIAGNOSIS — R7303 Prediabetes: Secondary | ICD-10-CM

## 2024-02-11 DIAGNOSIS — M5416 Radiculopathy, lumbar region: Secondary | ICD-10-CM

## 2024-02-11 DIAGNOSIS — G4733 Obstructive sleep apnea (adult) (pediatric): Secondary | ICD-10-CM | POA: Diagnosis not present

## 2024-02-11 DIAGNOSIS — E782 Mixed hyperlipidemia: Secondary | ICD-10-CM

## 2024-02-11 DIAGNOSIS — M48061 Spinal stenosis, lumbar region without neurogenic claudication: Secondary | ICD-10-CM

## 2024-02-11 DIAGNOSIS — I10 Essential (primary) hypertension: Secondary | ICD-10-CM

## 2024-02-11 DIAGNOSIS — M1A09X0 Idiopathic chronic gout, multiple sites, without tophus (tophi): Secondary | ICD-10-CM

## 2024-02-11 NOTE — Progress Notes (Signed)
 Established Patient Office Visit  Subjective:  Patient ID: Mike Barnes, male    DOB: Jun 19, 1956  Age: 68 y.o. MRN: [id number]  Chief Complaint  Patient presents with   Follow-up    3 month follow up    Patient comes in for his follow-up today.  Spanish translator is in the room.  Patient is generally feeling well and has no new complaints.  He has gained some weight, advised to watch it carefully.  He will return fasting for his blood work.    Since his last visit he has been to the urologist.  He also needed a repeat colonoscopy and a referral was sent to the GI.  Looks like he went there for complaining of abdominal pain and a colonoscopy has not yet been scheduled.  Patient reports that he will reach out to them and schedule his colonoscopy.    No other concerns at this time.   Past Medical History:  Diagnosis Date   GERD (gastroesophageal reflux disease)    Hyperlipidemia    Left sided sciatica 09/24/2022   Spinal stenosis of lumbar region with radiculopathy 09/24/2022    Past Surgical History:  Procedure Laterality Date   left shoulder surgery     LUMBAR LAMINECTOMY Left    SPINE SURGERY      Social History   Socioeconomic History   Marital status: Married    Spouse name: Not on file   Number of children: Not on file   Years of education: Not on file   Highest education level: Not on file  Occupational History   Not on file  Tobacco Use   Smoking status: Former    Types: Cigarettes   Smokeless tobacco: Never  Substance and Sexual Activity   Alcohol use: Not Currently   Drug use: Never   Sexual activity: Not on file  Other Topics Concern   Not on file  Social History Narrative   Not on file   Social Drivers of Health   Financial Resource Strain: Low Risk  (01/21/2024)   Received from [facility]   Overall Financial Resource Strain (CARDIA)    Difficulty of Paying Living Expenses: Not hard at all  Recent Concern: Financial  Resource Strain - Medium Risk (11/17/2023)   Received from [facility]   Overall Financial Resource Strain (CARDIA)    Difficulty of Paying Living Expenses: Somewhat hard  Food Insecurity: No Food Insecurity (01/21/2024)   Received from [facility]   Hunger Vital Sign    Within the past 12 months, you worried that your food would run out before you got the money to buy more.: Never true    Within the past 12 months, the food you bought just didn't last and you didn't have money to get more.: Never true  Recent Concern: Food Insecurity - Food Insecurity Present (11/17/2023)   Received from [facility]   Hunger Vital Sign    Within the past 12 months, you worried that your food would run out before you got the money to buy more.: Sometimes true    Within the past 12 months, the food you bought just didn't last and you didn't have money to get more.: Never true  Transportation Needs: No Transportation Needs (01/21/2024)   Received from [facility]    In the past 12 months, has lack of transportation kept you from medical appointments or from getting  medications?: No    Lack of Transportation (Non-Medical): No  Physical Activity: Not on file  Stress: Not on file  Social Connections: Not on file  Intimate Partner Violence: Not on file    Family History  Problem Relation Age of Onset   Hypertension Mother    Gout Father     No Known Allergies  Outpatient Medications Prior to Visit  Medication Sig   allopurinol  (ZYLOPRIM ) 300 MG tablet Take 1 tablet (300 mg total) by mouth daily.   amLODipine  (NORVASC ) 5 MG tablet Take 1 tablet (5 mg total) by mouth daily.   colchicine  0.6 MG tablet Take 1 tablet (0.6 mg total) by mouth 2 (two) times daily as needed.   donepezil (ARICEPT) 5 MG tablet Take 5 mg by mouth at bedtime.   pantoprazole  (PROTONIX ) 40 MG tablet Take 1 tablet (40 mg total) by mouth  daily.   rosuvastatin (CRESTOR) 10 MG tablet TAKE 1 TABLET BY MOUTH EVERY DAY   No facility-administered medications prior to visit.    Review of Systems  Constitutional: Negative.  Negative for chills, diaphoresis, fever, malaise/fatigue and weight loss.  HENT: Negative.  Negative for ear discharge, nosebleeds and sore throat.   Eyes: Negative.   Respiratory: Negative.  Negative for cough and shortness of breath.   Cardiovascular: Negative.  Negative for chest pain, palpitations and leg swelling.  Gastrointestinal: Negative.  Negative for abdominal pain, constipation, diarrhea, heartburn, nausea and vomiting.  Genitourinary: Negative.  Negative for dysuria and flank pain.  Musculoskeletal: Negative.  Negative for joint pain and myalgias.  Skin: Negative.   Neurological: Negative.  Negative for dizziness, tingling, tremors and headaches.  Endo/Heme/Allergies: Negative.   Psychiatric/Behavioral: Negative.  Negative for depression and suicidal ideas. The patient is not nervous/anxious.        Objective:   BP 124/74   Pulse 97   Ht 5' 3 (1.6 m)   Wt 208 lb (94.3 kg)   SpO2 97%   BMI 36.85 kg/m   Vitals:   02/11/24 1401  BP: 124/74  Pulse: 97  Height: 5' 3 (1.6 m)  Weight: 208 lb (94.3 kg)  SpO2: 97%  BMI (Calculated): 36.85    Physical Exam Vitals and nursing note reviewed.  Constitutional:      Appearance: Normal appearance.  HENT:     Head: Normocephalic and atraumatic.     Nose: Nose normal.     Mouth/Throat:     Mouth: Mucous membranes are moist.     Pharynx: Oropharynx is clear.  Eyes:     Conjunctiva/sclera: Conjunctivae normal.     Pupils: Pupils are equal, round, and reactive to light.  Cardiovascular:     Rate and Rhythm: Normal rate and regular rhythm.     Pulses: Normal pulses.     Heart sounds: Normal heart sounds.  Pulmonary:     Effort: Pulmonary effort is normal.     Breath sounds: Normal breath sounds.  Abdominal:     General: Bowel  sounds are normal.     Palpations: Abdomen is soft.  Musculoskeletal:        General: Normal range of motion.     Cervical back: Normal range of motion.  Skin:    General: Skin is warm and dry.  Neurological:     General: No focal deficit present.     Mental Status: He is alert and oriented to person, place, and time.  Psychiatric:        Mood and Affect: Mood  normal.        Behavior: Behavior normal.        Judgment: Judgment normal.      No results found for any visits on 02/11/24.  Recent Results (from the past 2160 hours)  Urinalysis, Complete w Microscopic -     Status: Abnormal   Collection Time: 11/17/23  3:05 PM  Result Value Ref Range   Color, Urine YELLOW YELLOW   APPearance CLEAR CLEAR   Specific Gravity, Urine 1.020 1.005 - 1.030   pH 6.0 5.0 - 8.0   Glucose, UA NEGATIVE NEGATIVE mg/dL   Hgb urine dipstick NEGATIVE NEGATIVE   Bilirubin Urine NEGATIVE NEGATIVE   Ketones, ur TRACE (A) NEGATIVE mg/dL   Protein, ur NEGATIVE NEGATIVE mg/dL   Nitrite NEGATIVE NEGATIVE   Leukocytes,Ua NEGATIVE NEGATIVE   Squamous Epithelial / HPF 0-5 0 - 5 /HPF   WBC, UA 0-5 0 - 5 WBC/hpf   RBC / HPF NONE SEEN 0 - 5 RBC/hpf   Bacteria, UA FEW (A) NONE SEEN   Mucus PRESENT     Comment: Performed at [facility], [address], [place], [place] [address]  Bladder Scan (Post Void Residual) in office     Status: None   Collection Time: 11/17/23  3:38 PM  Result Value Ref Range   Scan Result 34mL       Assessment & Plan:  Continue current medications.  Strict diet control and weight reduction emphasized.  Patient will reach out to his gastroenterologist to discuss colonoscopy. Problem List Items Addressed This Visit     Idiopathic chronic gout of multiple sites without tophus   Relevant Orders   Uric acid   Mixed hyperlipidemia   Spinal stenosis of lumbar region with radiculopathy   Essential hypertension, benign - Primary   Relevant Orders   CMP14+EGFR    Prediabetes   Relevant Orders   Hemoglobin A1c   Lipid Panel w/o Chol/HDL Ratio   Other Visit Diagnoses       OSA (obstructive sleep apnea)           Follow up 3 months.   Total time spent: 30 minutes  FERNAND FREDY RAMAN, MD  02/11/2024   This document may have been prepared by [facility] Voice Recognition software and as such may include unintentional dictation errors.

## 2024-02-23 ENCOUNTER — Other Ambulatory Visit

## 2024-02-23 DIAGNOSIS — R7303 Prediabetes: Secondary | ICD-10-CM | POA: Diagnosis not present

## 2024-02-23 DIAGNOSIS — G4733 Obstructive sleep apnea (adult) (pediatric): Secondary | ICD-10-CM | POA: Diagnosis not present

## 2024-02-23 DIAGNOSIS — I10 Essential (primary) hypertension: Secondary | ICD-10-CM | POA: Diagnosis not present

## 2024-02-23 DIAGNOSIS — M1A09X0 Idiopathic chronic gout, multiple sites, without tophus (tophi): Secondary | ICD-10-CM | POA: Diagnosis not present

## 2024-02-24 LAB — LIPID PANEL W/O CHOL/HDL RATIO
Cholesterol, Total: 191 mg/dL (ref 100–199)
HDL: 50 mg/dL (ref >39)
LDL Chol Calc (NIH): 109 mg/dL — ABNORMAL HIGH (ref 0–99)
Triglycerides: 182 mg/dL — ABNORMAL HIGH (ref 0–149)
VLDL Cholesterol Cal: 32 mg/dL (ref 5–40)

## 2024-02-24 LAB — URIC ACID: Uric Acid: 7.9 mg/dL (ref 3.8–8.4)

## 2024-02-24 LAB — CMP14+EGFR
ALT: 38 IU/L (ref 0–44)
AST: 35 IU/L (ref 0–40)
Albumin: 4.7 g/dL (ref 3.9–4.9)
Alkaline Phosphatase: 70 IU/L (ref 44–121)
BUN/Creatinine Ratio: 19 (ref 10–24)
BUN: 20 mg/dL (ref 8–27)
Bilirubin Total: 0.4 mg/dL (ref 0.0–1.2)
CO2: 24 mmol/L (ref 20–29)
Calcium: 10 mg/dL (ref 8.6–10.2)
Chloride: 99 mmol/L (ref 96–106)
Creatinine, Ser: 1.08 mg/dL (ref 0.76–1.27)
Globulin, Total: 2.2 g/dL (ref 1.5–4.5)
Glucose: 108 mg/dL — ABNORMAL HIGH (ref 70–99)
Potassium: 4.3 mmol/L (ref 3.5–5.2)
Sodium: 140 mmol/L (ref 134–144)
Total Protein: 6.9 g/dL (ref 6.0–8.5)
eGFR: 75 mL/min/1.73 (ref >59)

## 2024-02-24 LAB — HEMOGLOBIN A1C
Est. average glucose Bld gHb Est-mCnc: 123 mg/dL
Hgb A1c MFr Bld: 5.9 % — ABNORMAL HIGH (ref 4.8–5.6)

## 2024-02-25 ENCOUNTER — Ambulatory Visit: Payer: Self-pay | Admitting: Cardiology

## 2024-02-25 DIAGNOSIS — G4733 Obstructive sleep apnea (adult) (pediatric): Secondary | ICD-10-CM | POA: Diagnosis not present

## 2024-03-25 DIAGNOSIS — G4733 Obstructive sleep apnea (adult) (pediatric): Secondary | ICD-10-CM | POA: Diagnosis not present

## 2024-04-03 ENCOUNTER — Other Ambulatory Visit: Payer: Self-pay | Admitting: Internal Medicine

## 2024-04-04 ENCOUNTER — Ambulatory Visit

## 2024-04-05 DIAGNOSIS — M25641 Stiffness of right hand, not elsewhere classified: Secondary | ICD-10-CM | POA: Diagnosis not present

## 2024-04-20 ENCOUNTER — Other Ambulatory Visit: Payer: Self-pay

## 2024-04-20 DIAGNOSIS — Z87891 Personal history of nicotine dependence: Secondary | ICD-10-CM | POA: Diagnosis not present

## 2024-04-20 DIAGNOSIS — G4733 Obstructive sleep apnea (adult) (pediatric): Secondary | ICD-10-CM | POA: Diagnosis not present

## 2024-04-20 DIAGNOSIS — Z23 Encounter for immunization: Secondary | ICD-10-CM | POA: Diagnosis not present

## 2024-04-20 DIAGNOSIS — Z2821 Immunization not carried out because of patient refusal: Secondary | ICD-10-CM | POA: Diagnosis not present

## 2024-04-20 MED ORDER — ROSUVASTATIN CALCIUM 10 MG PO TABS: 10.0000 mg | ORAL_TABLET | Freq: Every day | ORAL | 0 refills | Status: DC

## 2024-05-11 DIAGNOSIS — H5213 Myopia, bilateral: Secondary | ICD-10-CM | POA: Diagnosis not present

## 2024-05-11 DIAGNOSIS — H52209 Unspecified astigmatism, unspecified eye: Secondary | ICD-10-CM | POA: Diagnosis not present

## 2024-05-11 DIAGNOSIS — H524 Presbyopia: Secondary | ICD-10-CM | POA: Diagnosis not present

## 2024-05-15 ENCOUNTER — Encounter: Payer: Self-pay | Admitting: Internal Medicine

## 2024-05-15 ENCOUNTER — Ambulatory Visit (INDEPENDENT_AMBULATORY_CARE_PROVIDER_SITE_OTHER): Admitting: Internal Medicine

## 2024-05-15 VITALS — BP 124/72 | HR 81 | Ht 63.0 in | Wt 207.6 lb

## 2024-05-15 DIAGNOSIS — R7303 Prediabetes: Secondary | ICD-10-CM

## 2024-05-15 DIAGNOSIS — G4733 Obstructive sleep apnea (adult) (pediatric): Secondary | ICD-10-CM | POA: Diagnosis not present

## 2024-05-15 DIAGNOSIS — Z6836 Body mass index (BMI) 36.0-36.9, adult: Secondary | ICD-10-CM | POA: Diagnosis not present

## 2024-05-15 DIAGNOSIS — I10 Essential (primary) hypertension: Secondary | ICD-10-CM | POA: Diagnosis not present

## 2024-05-15 DIAGNOSIS — E782 Mixed hyperlipidemia: Secondary | ICD-10-CM | POA: Diagnosis not present

## 2024-05-15 DIAGNOSIS — M1000 Idiopathic gout, unspecified site: Secondary | ICD-10-CM | POA: Diagnosis not present

## 2024-05-15 DIAGNOSIS — F332 Major depressive disorder, recurrent severe without psychotic features: Secondary | ICD-10-CM | POA: Insufficient documentation

## 2024-05-15 NOTE — Progress Notes (Signed)
 Established Patient Office Visit  Subjective:  Patient ID: Mike Barnes, male    DOB: 1955-12-19  Age: 68 y.o. MRN: [id number]  Chief Complaint  Patient presents with   Follow-up    3 month follow up    Patient is here today for follow up. He is doing well. He reports his right ring finger ROM has improved and he is still seeing his specialists in [place]. Patient reports taking his medications as prescribed. He is due for labs; Check labs tomorrow fasting.  Patient states he got a flu shot in the lat month from [facility]. Overall he has no complaints today.    No other concerns at this time.   Past Medical History:  Diagnosis Date   GERD (gastroesophageal reflux disease)    Hyperlipidemia    Left sided sciatica 09/24/2022   Spinal stenosis of lumbar region with radiculopathy 09/24/2022    Past Surgical History:  Procedure Laterality Date   left shoulder surgery     LUMBAR LAMINECTOMY Left    SPINE SURGERY      Social History   Socioeconomic History   Marital status: Married    Spouse name: Not on file   Number of children: Not on file   Years of education: Not on file   Highest education level: Not on file  Occupational History   Not on file  Tobacco Use   Smoking status: Former    Types: Cigarettes   Smokeless tobacco: Never  Substance and Sexual Activity   Alcohol use: Not Currently   Drug use: Never   Sexual activity: Not on file  Other Topics Concern   Not on file  Social History Narrative   Not on file   Social Drivers of Health   Financial Resource Strain: Low Risk  (01/21/2024)   Received from [facility]   Overall Financial Resource Strain (CARDIA)    Difficulty of Paying Living Expenses: Not hard at all  Recent Concern: Financial Resource Strain - Medium Risk (11/17/2023)   Received from [facility]   Overall Financial Resource Strain (CARDIA)    Difficulty of Paying Living Expenses: Somewhat  hard  Food Insecurity: No Food Insecurity (01/21/2024)   Received from [facility]   Hunger Vital Sign    Within the past 12 months, you worried that your food would run out before you got the money to buy more.: Never true    Within the past 12 months, the food you bought just didn't last and you didn't have money to get more.: Never true  Recent Concern: Food Insecurity - Food Insecurity Present (11/17/2023)   Received from [facility]   Hunger Vital Sign    Within the past 12 months, you worried that your food would run out before you got the money to buy more.: Sometimes true    Within the past 12 months, the food you bought just didn't last and you didn't have money to get more.: Never true  Transportation Needs: No Transportation Needs (01/21/2024)   Received from [facility] Transportation    In the past 12 months, has lack of transportation kept you from medical appointments or from getting medications?: No    Lack of Transportation (Non-Medical): No  Physical Activity: Not on file  Stress: Not on file  Social Connections: Not on file  Intimate Partner Violence: Not on file    Family History  Problem Relation Age of Onset   Hypertension Mother    Gout Father     No Known Allergies  Outpatient Medications Prior to Visit  Medication Sig   allopurinol  (ZYLOPRIM ) 300 MG tablet Take 1 tablet (300 mg total) by mouth daily.   amLODipine  (NORVASC ) 5 MG tablet Take 1 tablet (5 mg total) by mouth daily.   colchicine  0.6 MG tablet Take 1 tablet (0.6 mg total) by mouth 2 (two) times daily as needed.   donepezil (ARICEPT) 5 MG tablet Take 5 mg by mouth at bedtime.   pantoprazole  (PROTONIX ) 40 MG tablet Take 1 tablet (40 mg total) by mouth daily.   rosuvastatin  (CRESTOR ) 10 MG tablet Take 1 tablet (10 mg total) by mouth daily.   No facility-administered medications prior to visit.    Review of Systems  Constitutional:  Negative.  Negative for chills, fever and malaise/fatigue.  HENT: Negative.  Negative for congestion and sore throat.   Eyes: Negative.  Negative for blurred vision and pain.  Respiratory: Negative.  Negative for cough and shortness of breath.   Cardiovascular: Negative.  Negative for chest pain, palpitations and leg swelling.  Gastrointestinal: Negative.  Negative for abdominal pain, blood in stool, constipation, diarrhea, heartburn, melena, nausea and vomiting.  Genitourinary: Negative.  Negative for dysuria, flank pain, frequency and urgency.  Musculoskeletal: Negative.  Negative for joint pain and myalgias.  Skin: Negative.   Neurological: Negative.  Negative for dizziness, tingling, sensory change, weakness and headaches.  Endo/Heme/Allergies: Negative.   Psychiatric/Behavioral: Negative.  Negative for depression and suicidal ideas. The patient is not nervous/anxious.        Objective:   BP 124/72   Pulse 81   Ht 5' 3 (1.6 m)   Wt 207 lb 9.6 oz (94.2 kg)   SpO2 95%   BMI 36.77 kg/m   Vitals:   05/15/24 1321  BP: 124/72  Pulse: 81  Height: 5' 3 (1.6 m)  Weight: 207 lb 9.6 oz (94.2 kg)  SpO2: 95%  BMI (Calculated): 36.78    Physical Exam Vitals and nursing note reviewed.  Constitutional:      General: He is not in acute distress.    Appearance: Normal appearance. He is not ill-appearing.  HENT:     Head: Normocephalic and atraumatic.     Nose: Nose normal.     Mouth/Throat:     Mouth: Mucous membranes are moist.     Pharynx: Oropharynx is clear.  Eyes:     Conjunctiva/sclera: Conjunctivae normal.     Pupils: Pupils are equal, round, and reactive to light.  Cardiovascular:     Rate and Rhythm: Normal rate and regular rhythm.     Pulses: Normal pulses.     Heart sounds: Normal heart sounds.  Pulmonary:     Effort: Pulmonary effort is normal.     Breath sounds: Normal breath sounds. No wheezing or rhonchi.  Abdominal:     General: Bowel sounds are normal.  There is no distension.     Palpations: Abdomen is soft.     Tenderness: There is no abdominal tenderness.  Musculoskeletal:        General: Normal range of motion.     Cervical back: Normal range of motion and neck supple.     Right lower leg: No edema.     Left lower leg: No edema.  Skin:    General: Skin is warm and dry.     Capillary Refill: Capillary refill takes less than 2  seconds.  Neurological:     General: No focal deficit present.     Mental Status: He is alert and oriented to person, place, and time.     Sensory: No sensory deficit.     Motor: No weakness.  Psychiatric:        Mood and Affect: Mood normal.        Behavior: Behavior normal.        Judgment: Judgment normal.      No results found for any visits on 05/15/24.  Recent Results (from the past 2160 hours)  Hemoglobin A1c     Status: Abnormal   Collection Time: 02/23/24  9:49 AM  Result Value Ref Range   Hgb A1c MFr Bld 5.9 (H) 4.8 - 5.6 %    Comment:          Prediabetes: 5.7 - 6.4          Diabetes: >6.4          Glycemic control for adults with diabetes: <7.0    Est. average glucose Bld gHb Est-mCnc 123 mg/dL  Lipid Panel w/o Chol/HDL Ratio     Status: Abnormal   Collection Time: 02/23/24  9:49 AM  Result Value Ref Range   Cholesterol, Total 191 100 - 199 mg/dL   Triglycerides 817 (H) 0 - 149 mg/dL   HDL 50 >60 mg/dL   VLDL Cholesterol Cal 32 5 - 40 mg/dL   LDL Chol Calc (NIH) 890 (H) 0 - 99 mg/dL  RFE85+ZHQM     Status: Abnormal   Collection Time: 02/23/24  9:49 AM  Result Value Ref Range   Glucose 108 (H) 70 - 99 mg/dL   BUN 20 8 - 27 mg/dL   Creatinine, Ser 8.91 0.76 - 1.27 mg/dL   eGFR 75 >40 fO/fpw/8.26   BUN/Creatinine Ratio 19 10 - 24   Sodium 140 134 - 144 mmol/L   Potassium 4.3 3.5 - 5.2 mmol/L   Chloride 99 96 - 106 mmol/L   CO2 24 20 - 29 mmol/L   Calcium  10.0 8.6 - 10.2 mg/dL   Total Protein 6.9 6.0 - 8.5 g/dL   Albumin 4.7 3.9 - 4.9 g/dL   Globulin, Total 2.2 1.5 - 4.5  g/dL   Bilirubin Total 0.4 0.0 - 1.2 mg/dL   Alkaline Phosphatase 70 44 - 121 IU/L   AST 35 0 - 40 IU/L   ALT 38 0 - 44 IU/L  Uric acid     Status: None   Collection Time: 02/23/24  9:49 AM  Result Value Ref Range   Uric Acid 7.9 3.8 - 8.4 mg/dL    Comment:            Therapeutic target for gout patients: <6.0      Assessment & Plan:  Continue taking medications as prescribed. Check routine fasting blood work and FU with patient on results. Reinforced healthy diet and exercise as tolerated. Problem List Items Addressed This Visit     Hyperlipidemia, mixed   Relevant Orders   CBC with Differential/Platelet   CMP14+EGFR   Lipid Panel w/o Chol/HDL Ratio   Essential hypertension   Relevant Orders   CBC with Differential/Platelet   CMP14+EGFR   Prediabetes   Relevant Orders   CBC with Differential/Platelet   Hemoglobin A1c   Gout - Primary   Relevant Orders   Uric acid   OSA (obstructive sleep apnea)   Body mass index (BMI) of 36.0-36.9 in adult   Severe episode  of recurrent major depressive disorder, without psychotic features (HCC)    Return in about 3 months (around 08/15/2024).   Total time spent: 25 minutes. This time includes review of previous notes and results and patient face to face interaction during today's visit.    FERNAND FREDY RAMAN, MD  05/15/2024   This document may have been prepared by [facility] Voice Recognition software and as such may include unintentional dictation errors.

## 2024-05-16 ENCOUNTER — Other Ambulatory Visit

## 2024-05-16 DIAGNOSIS — M1000 Idiopathic gout, unspecified site: Secondary | ICD-10-CM | POA: Diagnosis not present

## 2024-05-16 DIAGNOSIS — M65331 Trigger finger, right middle finger: Secondary | ICD-10-CM | POA: Diagnosis not present

## 2024-05-16 DIAGNOSIS — I10 Essential (primary) hypertension: Secondary | ICD-10-CM

## 2024-05-16 DIAGNOSIS — R7303 Prediabetes: Secondary | ICD-10-CM

## 2024-05-16 DIAGNOSIS — Z1331 Encounter for screening for depression: Secondary | ICD-10-CM | POA: Diagnosis not present

## 2024-05-16 DIAGNOSIS — E782 Mixed hyperlipidemia: Secondary | ICD-10-CM | POA: Diagnosis not present

## 2024-05-16 DIAGNOSIS — D649 Anemia, unspecified: Secondary | ICD-10-CM | POA: Diagnosis not present

## 2024-05-16 DIAGNOSIS — R202 Paresthesia of skin: Secondary | ICD-10-CM | POA: Diagnosis not present

## 2024-05-17 ENCOUNTER — Ambulatory Visit: Payer: Self-pay | Admitting: Internal Medicine

## 2024-05-17 DIAGNOSIS — D649 Anemia, unspecified: Secondary | ICD-10-CM

## 2024-05-17 LAB — CBC WITH DIFFERENTIAL/PLATELET
Basophils Absolute: 0 x10E3/uL (ref 0.0–0.2)
Basos: 1 %
EOS (ABSOLUTE): 0.3 x10E3/uL (ref 0.0–0.4)
Eos: 4 %
Hematocrit: 41 % (ref 37.5–51.0)
Hemoglobin: 13.7 g/dL (ref 13.0–17.7)
Immature Grans (Abs): 0 x10E3/uL (ref 0.0–0.1)
Immature Granulocytes: 0 %
Lymphocytes Absolute: 2.3 x10E3/uL (ref 0.7–3.1)
Lymphs: 39 %
MCH: 33.8 pg — ABNORMAL HIGH (ref 26.6–33.0)
MCHC: 33.4 g/dL (ref 31.5–35.7)
MCV: 101 fL — ABNORMAL HIGH (ref 79–97)
Monocytes Absolute: 0.6 x10E3/uL (ref 0.1–0.9)
Monocytes: 11 %
Neutrophils Absolute: 2.7 x10E3/uL (ref 1.4–7.0)
Neutrophils: 45 %
Platelets: 142 x10E3/uL — ABNORMAL LOW (ref 150–450)
RBC: 4.05 x10E6/uL — ABNORMAL LOW (ref 4.14–5.80)
RDW: 13.3 % (ref 11.6–15.4)
WBC: 6 x10E3/uL (ref 3.4–10.8)

## 2024-05-17 LAB — CMP14+EGFR
ALT: 36 IU/L (ref 0–44)
AST: 28 IU/L (ref 0–40)
Albumin: 4.5 g/dL (ref 3.9–4.9)
Alkaline Phosphatase: 76 IU/L (ref 47–123)
BUN/Creatinine Ratio: 18 (ref 10–24)
BUN: 21 mg/dL (ref 8–27)
Bilirubin Total: 0.4 mg/dL (ref 0.0–1.2)
CO2: 25 mmol/L (ref 20–29)
Calcium: 9.6 mg/dL (ref 8.6–10.2)
Chloride: 102 mmol/L (ref 96–106)
Creatinine, Ser: 1.2 mg/dL (ref 0.76–1.27)
Globulin, Total: 2.3 g/dL (ref 1.5–4.5)
Glucose: 124 mg/dL — ABNORMAL HIGH (ref 70–99)
Potassium: 4.6 mmol/L (ref 3.5–5.2)
Sodium: 141 mmol/L (ref 134–144)
Total Protein: 6.8 g/dL (ref 6.0–8.5)
eGFR: 66 mL/min/1.73 (ref >59)

## 2024-05-17 LAB — LIPID PANEL W/O CHOL/HDL RATIO
Cholesterol, Total: 141 mg/dL (ref 100–199)
HDL: 47 mg/dL (ref >39)
LDL Chol Calc (NIH): 68 mg/dL (ref 0–99)
Triglycerides: 150 mg/dL — ABNORMAL HIGH (ref 0–149)
VLDL Cholesterol Cal: 26 mg/dL (ref 5–40)

## 2024-05-17 LAB — HEMOGLOBIN A1C
Est. average glucose Bld gHb Est-mCnc: 134 mg/dL
Hgb A1c MFr Bld: 6.3 % — ABNORMAL HIGH (ref 4.8–5.6)

## 2024-05-17 LAB — URIC ACID: Uric Acid: 7.7 mg/dL (ref 3.8–8.4)

## 2024-05-18 LAB — SPECIMEN STATUS REPORT

## 2024-05-18 LAB — VITAMIN B12: Vitamin B-12: 584 pg/mL (ref 232–1245)

## 2024-05-18 LAB — FOLATE: Folate: 13 ng/mL (ref >3.0)

## 2024-05-18 NOTE — Progress Notes (Signed)
 Left vm to return call.

## 2024-05-23 NOTE — Progress Notes (Signed)
 Pt states he needs interpreter.

## 2024-06-25 DIAGNOSIS — G4733 Obstructive sleep apnea (adult) (pediatric): Secondary | ICD-10-CM | POA: Diagnosis not present

## 2024-06-30 ENCOUNTER — Other Ambulatory Visit: Payer: Self-pay

## 2024-07-03 MED ORDER — ROSUVASTATIN CALCIUM 10 MG PO TABS: 10.0000 mg | ORAL_TABLET | Freq: Every day | ORAL | 0 refills | Status: AC

## 2024-08-15 ENCOUNTER — Ambulatory Visit
